# Patient Record
Sex: Female | Born: 1975 | Race: White | Hispanic: No | Marital: Married | Smoking: Never smoker
Health system: Southern US, Community
[De-identification: ages and names within clinical notes are randomized; demographics above are authoritative.]

## PROBLEM LIST (undated history)

## (undated) DIAGNOSIS — Z803 Family history of malignant neoplasm of breast: Secondary | ICD-10-CM

## (undated) DIAGNOSIS — N941 Unspecified dyspareunia: Secondary | ICD-10-CM

## (undated) DIAGNOSIS — D259 Leiomyoma of uterus, unspecified: Secondary | ICD-10-CM

## (undated) DIAGNOSIS — N921 Excessive and frequent menstruation with irregular cycle: Secondary | ICD-10-CM

## (undated) DIAGNOSIS — D500 Iron deficiency anemia secondary to blood loss (chronic): Secondary | ICD-10-CM

## (undated) HISTORY — PX: WISDOM TOOTH EXTRACTION: SHX21

## (undated) HISTORY — PX: NO PAST SURGERIES: SHX2092

## (undated) HISTORY — PX: COLONOSCOPY: SHX174

## (undated) HISTORY — DX: Family history of malignant neoplasm of breast: Z80.3

---

## 1998-07-04 ENCOUNTER — Other Ambulatory Visit: Admission: RE | Admit: 1998-07-04 | Discharge: 1998-07-04 | Payer: Self-pay | Admitting: *Deleted

## 1998-07-27 ENCOUNTER — Ambulatory Visit (HOSPITAL_COMMUNITY): Admission: RE | Admit: 1998-07-27 | Discharge: 1998-07-27 | Payer: Self-pay | Admitting: *Deleted

## 1999-07-02 ENCOUNTER — Other Ambulatory Visit: Admission: RE | Admit: 1999-07-02 | Discharge: 1999-07-02 | Payer: Self-pay | Admitting: Obstetrics and Gynecology

## 2000-01-02 ENCOUNTER — Inpatient Hospital Stay (HOSPITAL_COMMUNITY): Admission: AD | Admit: 2000-01-02 | Discharge: 2000-01-02 | Payer: Self-pay | Admitting: Obstetrics and Gynecology

## 2000-01-23 ENCOUNTER — Encounter: Payer: Self-pay | Admitting: *Deleted

## 2000-01-23 ENCOUNTER — Inpatient Hospital Stay (HOSPITAL_COMMUNITY): Admission: AD | Admit: 2000-01-23 | Discharge: 2000-01-23 | Payer: Self-pay | Admitting: *Deleted

## 2000-01-28 ENCOUNTER — Inpatient Hospital Stay (HOSPITAL_COMMUNITY): Admission: AD | Admit: 2000-01-28 | Discharge: 2000-01-30 | Payer: Self-pay | Admitting: Obstetrics and Gynecology

## 2000-03-13 ENCOUNTER — Other Ambulatory Visit: Admission: RE | Admit: 2000-03-13 | Discharge: 2000-03-13 | Payer: Self-pay | Admitting: Obstetrics and Gynecology

## 2001-02-23 ENCOUNTER — Other Ambulatory Visit: Admission: RE | Admit: 2001-02-23 | Discharge: 2001-02-23 | Payer: Self-pay | Admitting: Obstetrics and Gynecology

## 2002-02-24 ENCOUNTER — Other Ambulatory Visit: Admission: RE | Admit: 2002-02-24 | Discharge: 2002-02-24 | Payer: Self-pay | Admitting: Obstetrics and Gynecology

## 2003-04-28 ENCOUNTER — Other Ambulatory Visit: Admission: RE | Admit: 2003-04-28 | Discharge: 2003-04-28 | Payer: Self-pay | Admitting: Obstetrics and Gynecology

## 2004-12-04 ENCOUNTER — Inpatient Hospital Stay (HOSPITAL_COMMUNITY): Admission: AD | Admit: 2004-12-04 | Discharge: 2004-12-06 | Payer: Self-pay | Admitting: Obstetrics and Gynecology

## 2010-06-14 ENCOUNTER — Ambulatory Visit (HOSPITAL_COMMUNITY): Admission: RE | Admit: 2010-06-14 | Discharge: 2010-06-14 | Payer: Self-pay | Admitting: Obstetrics and Gynecology

## 2010-08-25 ENCOUNTER — Encounter: Payer: Self-pay | Admitting: Obstetrics and Gynecology

## 2011-06-07 ENCOUNTER — Other Ambulatory Visit (HOSPITAL_COMMUNITY): Payer: Self-pay | Admitting: Obstetrics and Gynecology

## 2011-06-07 DIAGNOSIS — Z1231 Encounter for screening mammogram for malignant neoplasm of breast: Secondary | ICD-10-CM

## 2011-07-16 ENCOUNTER — Ambulatory Visit (HOSPITAL_COMMUNITY)
Admission: RE | Admit: 2011-07-16 | Discharge: 2011-07-16 | Disposition: A | Discharge status: Home / Self Care | Payer: PRIVATE HEALTH INSURANCE | Source: Ambulatory Visit | Attending: Obstetrics and Gynecology | Admitting: Obstetrics and Gynecology

## 2011-07-16 DIAGNOSIS — Z1231 Encounter for screening mammogram for malignant neoplasm of breast: Secondary | ICD-10-CM | POA: Insufficient documentation

## 2012-07-09 ENCOUNTER — Other Ambulatory Visit (HOSPITAL_COMMUNITY): Payer: Self-pay | Admitting: Obstetrics and Gynecology

## 2012-07-09 DIAGNOSIS — Z1231 Encounter for screening mammogram for malignant neoplasm of breast: Secondary | ICD-10-CM

## 2012-08-11 ENCOUNTER — Ambulatory Visit (HOSPITAL_COMMUNITY)
Admission: RE | Admit: 2012-08-11 | Discharge: 2012-08-11 | Disposition: A | Discharge status: Home / Self Care | Payer: PRIVATE HEALTH INSURANCE | Source: Ambulatory Visit | Attending: Obstetrics and Gynecology | Admitting: Obstetrics and Gynecology

## 2012-08-11 DIAGNOSIS — Z1231 Encounter for screening mammogram for malignant neoplasm of breast: Secondary | ICD-10-CM | POA: Insufficient documentation

## 2012-08-11 DIAGNOSIS — Z803 Family history of malignant neoplasm of breast: Secondary | ICD-10-CM | POA: Insufficient documentation

## 2014-05-17 ENCOUNTER — Other Ambulatory Visit (HOSPITAL_COMMUNITY): Payer: Self-pay | Admitting: Obstetrics and Gynecology

## 2014-05-17 DIAGNOSIS — Z1231 Encounter for screening mammogram for malignant neoplasm of breast: Secondary | ICD-10-CM

## 2014-05-24 ENCOUNTER — Ambulatory Visit (HOSPITAL_COMMUNITY)
Admission: RE | Admit: 2014-05-24 | Discharge: 2014-05-24 | Disposition: A | Discharge status: Home / Self Care | Payer: BC Managed Care – PPO | Source: Ambulatory Visit | Attending: Obstetrics and Gynecology | Admitting: Obstetrics and Gynecology

## 2014-05-24 DIAGNOSIS — Z1231 Encounter for screening mammogram for malignant neoplasm of breast: Secondary | ICD-10-CM | POA: Diagnosis present

## 2015-08-22 ENCOUNTER — Other Ambulatory Visit: Payer: Self-pay

## 2015-08-22 DIAGNOSIS — N920 Excessive and frequent menstruation with regular cycle: Secondary | ICD-10-CM | POA: Insufficient documentation

## 2015-08-22 DIAGNOSIS — K2100 Gastro-esophageal reflux disease with esophagitis, without bleeding: Secondary | ICD-10-CM | POA: Insufficient documentation

## 2015-08-22 DIAGNOSIS — M2550 Pain in unspecified joint: Secondary | ICD-10-CM | POA: Insufficient documentation

## 2015-08-22 DIAGNOSIS — N3010 Interstitial cystitis (chronic) without hematuria: Secondary | ICD-10-CM | POA: Insufficient documentation

## 2015-08-22 DIAGNOSIS — G2581 Restless legs syndrome: Secondary | ICD-10-CM | POA: Insufficient documentation

## 2016-11-18 ENCOUNTER — Ambulatory Visit: Payer: Self-pay | Admitting: Advanced Practice Midwife

## 2017-02-02 DIAGNOSIS — Z803 Family history of malignant neoplasm of breast: Secondary | ICD-10-CM

## 2017-02-02 HISTORY — DX: Family history of malignant neoplasm of breast: Z80.3

## 2017-02-17 ENCOUNTER — Encounter: Payer: Self-pay | Admitting: Obstetrics and Gynecology

## 2017-02-17 ENCOUNTER — Ambulatory Visit (INDEPENDENT_AMBULATORY_CARE_PROVIDER_SITE_OTHER): Payer: Self-pay | Admitting: Obstetrics and Gynecology

## 2017-02-17 VITALS — BP 102/70 | HR 60 | Ht 67.0 in | Wt 123.0 lb

## 2017-02-17 DIAGNOSIS — Z803 Family history of malignant neoplasm of breast: Secondary | ICD-10-CM

## 2017-02-17 DIAGNOSIS — Z01419 Encounter for gynecological examination (general) (routine) without abnormal findings: Secondary | ICD-10-CM

## 2017-02-17 DIAGNOSIS — Z3041 Encounter for surveillance of contraceptive pills: Secondary | ICD-10-CM

## 2017-02-17 MED ORDER — NORETHIN ACE-ETH ESTRAD-FE 1-20 MG-MCG(24) PO TABS: 1.0000 | ORAL_TABLET | Freq: Every day | ORAL | 4 refills | Status: DC

## 2017-02-17 NOTE — Progress Notes (Signed)
Gynecology Annual Exam  PCP: Patient, No Pcp Per  Chief Complaint  Patient presents with  . Gynecologic Exam   History of Present Illness:  [name] is a 41 y.o. [phone] who LMP was No LMP recorded (within months)., presents today for her annual examination.  Her menses are mostly regular, however, will occasionally occur off-schedule with the OCPs. She does not have vasomotor sx.   She is single partner, contraception - OCP (estrogen/progesterone). She does not have vaginal dryness.  Last Pap: several years, unknown. SHe is self-pay and has not had a pap smear in a while. Hx of STDs: none  Last mammogram: 2015, [facility] 1 There is a FH of breast cancer. There is no FH of ovarian cancer. The patient does do self-breast exams.  Colonoscopy: no DEXA: has not been screened for osteoporosis  Tobacco use: The patient denies current or previous tobacco use. Alcohol use: none Exercise: moderately active  She does get adequate calcium and Vitamin D in her diet.  The patient wears seatbelts: yes.     Past Medical History:  Diagnosis Date  . Family history of breast cancer    Past Surgical History: Denies  Prior to Admission medications   Medication Sig Start Date End Date Taking? Authorizing Provider  Calcium-Magnesium-Vitamin D (CALCIUM 500 PO) Take by mouth.    [provider]  cholecalciferol (VITAMIN D) 1000 units tablet Take by mouth.    [provider]  Norethindrone Acetate-Ethinyl Estrad-FE (LOESTRIN 24 FE) 1-20 MG-MCG(24) tablet LOESTRIN 24 FE, 1-20MG -MCG (Oral Tablet)  1 Every Day for 0 days  Quantity: 0.00;  Refills: 0   Ordered :11-Nov-2006  Amie CritchleyMitchell, Stephanie ;  Started 11-Nov-2006 Active 11/11/06   [provider]  omeprazole (PRILOSEC) 20 MG capsule Take by mouth. 01/12/14   [provider]  Polyethylene Glycol 3350 GRAN Take by mouth.    [provider]   Allergies: NKDA  Obstetric History: [id number]: G2P2002  Family  History  Problem Relation Age of Onset  . Breast cancer Mother 3729        again at 7150  . Breast cancer Maternal Aunt   . Liver cancer Maternal Grandfather   . Breast cancer Maternal Aunt     Social History   Social History  . Marital status: Married    Spouse name: N/A  . Number of children: N/A  . Years of education: N/A   Occupational History  . Not on file.   Social History Main Topics  . Smoking status: Never Smoker  . Smokeless tobacco: Never Used  . Alcohol use No  . Drug use: No  . Sexual activity: Yes    Birth control/ protection: Pill   Other Topics Concern  . Not on file   Social History Narrative  . No narrative on file    Review of Systems  Constitutional: Negative.   HENT: Negative.   Eyes: Negative.   Respiratory: Negative.   Cardiovascular: Negative.   Gastrointestinal: Positive for constipation. Negative for abdominal pain, blood in stool, diarrhea, heartburn, melena, nausea and vomiting.       Hemorrhoid discomfort  Genitourinary: Negative.   Musculoskeletal: Negative.   Skin: Negative.   Neurological: Negative.   Psychiatric/Behavioral: Negative.      Physical Exam BP 102/70   Pulse 60   Ht 5\' 7"  (1.702 m)   Wt 123 lb (55.8 kg)   LMP  (Within Months)   BMI 19.26 kg/m   Physical Exam  Constitutional: She  is oriented to person, place, and time. She appears well-developed and well-nourished. No distress.  Genitourinary: Uterus normal. Pelvic exam was performed with patient supine. There is no rash, tenderness, lesion or injury on the right labia. There is no rash, tenderness, lesion or injury on the left labia. No erythema, tenderness or bleeding in the vagina. No signs of injury around the vagina. No vaginal discharge found. Right adnexum does not display mass, does not display tenderness and does not display fullness. Left adnexum does not display mass, does not display tenderness and does not display fullness. Cervix does not exhibit motion  tenderness, lesion, discharge or polyp.   Uterus is mobile and anteverted. Uterus is not enlarged, tender or exhibiting a mass.  HENT:  Head: Normocephalic and atraumatic.  Eyes: EOM are normal. No scleral icterus.  Neck: Normal range of motion. Neck supple. No thyromegaly present.  Cardiovascular: Normal rate and regular rhythm.  Exam reveals no gallop and no friction rub.   No murmur heard. Pulmonary/Chest: Effort normal and breath sounds normal. No respiratory distress. She has no wheezes. She has no rales. Right breast exhibits no inverted nipple, no mass, no nipple discharge, no skin change and no tenderness. Left breast exhibits no inverted nipple, no mass, no nipple discharge, no skin change and no tenderness.  Abdominal: Soft. Bowel sounds are normal. She exhibits no distension and no mass. There is no tenderness. There is no rebound and no guarding.  Musculoskeletal: Normal range of motion. She exhibits no edema or tenderness.  Lymphadenopathy:    She has no cervical adenopathy.       Right: No inguinal adenopathy present.       Left: No inguinal adenopathy present.  Neurological: She is alert and oriented to person, place, and time. No cranial nerve deficit.  Skin: Skin is warm and dry. No rash noted. No erythema.  Psychiatric: She has a normal mood and affect. Her behavior is normal. Judgment normal.   Female chaperone present for pelvic and breast  portions of the physical exam  Results: AUDIT Questionnaire (screen for alcoholism): 0 PHQ-9: 0  Assessment: 41 y.o. [id number] female here for routine gynecologic examination.  Plan: Problem List Items Addressed This Visit    Family history of breast cancer    Other Visit Diagnoses    Encounter for surveillance of contraceptive pills    -  Primary   Relevant Medications   Norethindrone Acetate-Ethinyl Estrad-FE (LOESTRIN 24 FE) 1-20 MG-MCG(24) tablet   Women's annual routine gynecological examination         Screening: --  Blood pressure screen normal -- Colonoscopy - not due -- Mammogram - Patient is self-pay. She has a family history of breast cancer and has not had a mammogram in several years. I HIGHLY recommended that she receive a mammogram. She was given information regardin the BCCCP program to see if she is eligible for coverage.  She states that she will look into it.  -- Weight screening: normal -- Depression screening negative (PHQ-9) -- Nutrition: normal -- cholesterol screening: not due for screening -- osteoporosis screening: not due -- tobacco screening: not using -- alcohol screening: AUDIT questionnaire indicates low-risk usage. -- family history of breast cancer screening: done. not at high risk. -- no evidence of domestic violence or intimate partner violence. -- STD screening: gonorrhea/chlamydia NAAT not collected per patient request. -- pap smear She is due for a pap smear per ASCCP guidelines. However, she refuses today due to self-pay status.  BCCCP  reiterated to her, as above.  -- HPV vaccination series: not eligilbe   Family History of Breast Cancer: Discussed screening. As self-pay, she, again, is referred to [facility]  She may be eligible for screening through other programs throughout the state.  She thanked me for bringing this up.  Notably, her prior provider, Dr. Vergie Living, has also discussed this with her.  Thomasene Mohair, MD 02/17/2017 5:35 PM

## 2017-02-17 NOTE — Patient Instructions (Signed)
You have been provided with information to contacting the [facility] program at:  [email or web address].ncdhhs.gov/  Please utilize this resource to see if you are eligible for coverage for mammography and pap smears.

## 2017-02-24 ENCOUNTER — Telehealth: Payer: Self-pay | Admitting: Obstetrics and Gynecology

## 2017-02-24 NOTE — Telephone Encounter (Signed)
Patent has been taking Junel SE 1/20 but recently per Pharmacy was prescribed Junel SE 24, which is much more expensive then what she was taking.  She is not sure why the prescription was changed.  [place] [facility] or call patient and explain why the prescription was changed.

## 2017-02-25 ENCOUNTER — Telehealth: Payer: Self-pay

## 2017-02-25 DIAGNOSIS — N920 Excessive and frequent menstruation with regular cycle: Secondary | ICD-10-CM

## 2017-02-25 MED ORDER — NORETHIN ACE-ETH ESTRAD-FE 1-20 MG-MCG PO TABS
1.0000 | ORAL_TABLET | Freq: Every day | ORAL | 3 refills | Status: DC
Start: 2017-02-25 — End: 2018-01-06

## 2017-02-25 NOTE — Telephone Encounter (Signed)
SDJ sent in refills of pt's bcp but it's a diff rx and it costs more.  Pt wants what she had before - Junel 1mg -20 which is cheaper.  She recv'd Junel Fe 24.  [phone]

## 2017-02-26 ENCOUNTER — Encounter: Payer: Self-pay | Admitting: Physician Assistant

## 2017-02-26 ENCOUNTER — Ambulatory Visit: Payer: Self-pay | Admitting: Physician Assistant

## 2017-02-26 VITALS — BP 112/68 | HR 60 | Temp 98.2°F | Resp 16 | Wt 122.0 lb

## 2017-02-26 DIAGNOSIS — K645 Perianal venous thrombosis: Secondary | ICD-10-CM

## 2017-02-26 NOTE — Patient Instructions (Signed)
Surgical Procedures for Hemorrhoids Surgical procedures can be used to treat hemorrhoids. Hemorrhoids are swollen veins that are inside the rectum (internal hemorrhoids) or around the anus (external hemorrhoids). They are caused by increased pressure in the anal area. This pressure may result from straining to have a bowel movement (constipation), diarrhea, pregnancy, obesity, anal sex, or sitting for long periods of time. Hemorrhoids can cause symptoms such as pain and bleeding. Surgery may be needed if diet changes, lifestyle changes, and other treatments do not help your symptoms. Various surgical methods may be used. Three common methods are:  Closed hemorrhoidectomy. The hemorrhoids are surgically removed, and the surgical cuts (incisions) are closed with stitches (sutures).  Open hemorrhoidectomy. The hemorrhoids are surgically removed, but the incisions are allowed to heal without sutures.  Stapled hemorrhoidopexy. The hemorrhoids are removed using a device that takes out a ring of excess tissue.  Tell a health care provider about:  Any allergies you have.  All medicines you are taking, including vitamins, herbs, eye drops, creams, and over-the-counter medicines.  Any problems you or family members have had with anesthetic medicines.  Any blood disorders you have.  Any surgeries you have had.  Any medical conditions you have.  Whether you are pregnant or may be pregnant. What are the risks? Generally, this is a safe procedure. However, problems may occur, including:  Infection.  Bleeding.  Allergic reactions to medicines.  Damage to other structures or organs.  Pain.  Constipation.  Difficulty passing urine.  Narrowing of the anal canal (stenosis).  Difficulty controlling bowel movements (incontinence).  What happens before the procedure?  Ask your health care provider about: ? Changing or stopping your regular medicines. This is especially important if you are  taking diabetes medicines or blood thinners. ? Taking medicines such as aspirin and ibuprofen. These medicines can thin your blood. Do not take these medicines before your procedure if your health care provider instructs you not to.  You may need to have a procedure to examine the inside of your colon with a scope (colonoscopy). Your health care provider may do this to make sure that there are no other causes for your bleeding or pain.  Follow instructions from your health care provider about eating or drinking restrictions.  You may be instructed to take a laxative and an enema to clean out your colon before surgery (bowel prep). Carefully follow instructions from your health care provider about bowel prep.  Ask your health care provider how your surgical site will be marked or identified.  You may be given antibiotic medicine to help prevent infection.  Plan to have someone take you home after the procedure. What happens during the procedure?  To reduce your risk of infection: ? Your health care team will wash or sanitize their hands. ? Your skin will be washed with soap.  An IV tube will be inserted into one of your veins.  You will be given one or more of the following: ? A medicine to help you relax (sedative). ? A medicine to numb the area (local anesthetic). ? A medicine to make you fall asleep (general anesthetic). ? A medicine that is injected into an area of your body to numb everything below the injection site (regional anesthetic).  A lubricating jelly may be placed into your rectum.  Your surgeon will insert a short scope (anoscope) into your rectum to examine the hemorrhoids.  One of the following hemorrhoid procedures will be performed. Closed Hemorrhoidectomy  Your surgeon   will use surgical instruments to open the tissue around the hemorrhoids.  The veins that supply the hemorrhoids will be tied off with a suture.  The hemorrhoids will be removed.  The tissue  that surrounds the hemorrhoids will be closed with sutures that your body can absorb (absorbable sutures). Open Hemorrhoidectomy  The hemorrhoids will be removed with surgical instruments.  The incisions will be left open to heal without sutures. Stapled Hemorrhoidopexy  Your surgeon will use a circular stapling device to remove the hemorrhoids.  The device will be inserted into your anus. It will remove a circular ring of tissue that includes hemorrhoid tissue and some tissue above the hemorrhoids.  The staples in the device will close the edges of removed tissue. This will cut off the blood supply to the hemorrhoids and will pull any remaining hemorrhoids back into place. Each of these procedures may vary among health care providers and hospitals. What happens after the procedure?  Your blood pressure, heart rate, breathing rate, and blood oxygen level will be monitored often until the medicines you were given have worn off.  You will be given pain medicine as needed. This information is not intended to replace advice given to you by your health care provider. Make sure you discuss any questions you have with your health care provider. Document Released: 05/19/2009 Document Revised: 12/28/2015 Document Reviewed: 10/17/2014 Elsevier Interactive Patient Education  2018 Elsevier Inc.  

## 2017-02-26 NOTE — Progress Notes (Signed)
       Patient: Brittany Murray Female    DOB: October 22, 1975   41 y.o.   MRN: [id number] Visit Date: 02/26/2017  Today's Provider: Trey SailorsAdriana M Timmothy Baranowski, PA-C   Chief Complaint  Patient presents with  . Hemorrhoids    Trouble since April but worsening since Sunday   Subjective:    HPI  Hemorrhoids: Patient complains of evaluation of rectal bleeding. Onset of symptoms was gradual starting 3 months ago ago with gradually worsening course since that time.  She describes symptoms as bleeding which only occurs with bowel movements, pain with sitting and painful defecation. Treatment to date has been OTC creams: ineffective. Patient denies family hx of colorectal CA, history of previous STDs and known or suspected STD exposure.  No Known Allergies   Current Outpatient Prescriptions:  .  Calcium-Magnesium-Vitamin D (CALCIUM 500 PO), Take by mouth., Disp: , Rfl:  .  cholecalciferol (VITAMIN D) 1000 units tablet, Take by mouth., Disp: , Rfl:  .  norethindrone-ethinyl estradiol (JUNEL FE 1/20) 1-20 MG-MCG tablet, Take 1 tablet by mouth daily., Disp: 84 tablet, Rfl: 3 .  omeprazole (PRILOSEC) 20 MG capsule, Take by mouth., Disp: , Rfl:  .  Polyethylene Glycol 3350 GRAN, Take by mouth., Disp: , Rfl:   Review of Systems  Constitutional: Negative.   Gastrointestinal: Positive for anal bleeding and rectal pain. Negative for abdominal distention, blood in stool, constipation and diarrhea.  Neurological: Negative for dizziness, light-headedness and headaches.    Social History  Substance Use Topics  . Smoking status: Never Smoker  . Smokeless tobacco: Never Used  . Alcohol use No   Objective:   BP 112/68 (BP Location: Left Arm, Patient Position: Sitting, Cuff Size: Normal)   Pulse 60   Temp 98.2 F (36.8 C) (Oral)   Resp 16   Wt 122 lb (55.3 kg)   BMI 19.11 kg/m  Vitals:   02/26/17 1112  BP: 112/68  Pulse: 60  Resp: 16  Temp: 98.2 F (36.8 C)  TempSrc: Oral  Weight: 122 lb (55.3 kg)      Physical Exam  Constitutional: She is oriented to person, place, and time.  Genitourinary: Rectal exam shows external hemorrhoid and tenderness. Rectal exam shows no fissure.  Genitourinary Comments: Thrombosed external hemorrhoid present.  Neurological: She is alert and oriented to person, place, and time.  Skin: Skin is warm and dry.  Psychiatric: She has a normal mood and affect. Her behavior is normal.        Assessment & Plan:     1. Thrombosed external hemorrhoid  She'll be scheduled tomorrow with Antony ContrasJenni to I&D the hemorrhoid.   Return in about 1 day (around 02/27/2017) [facility].  The entirety of the information documented in the History of Present Illness, Review of Systems and Physical Exam were personally obtained by me. Portions of this information were initially documented by Kavin LeechLaura Walsh, CMA and reviewed by me for thoroughness and accuracy.          Trey SailorsAdriana M Adin Laker, PA-C  [facility]

## 2017-02-27 ENCOUNTER — Encounter: Payer: Self-pay | Admitting: Physician Assistant

## 2017-02-27 ENCOUNTER — Ambulatory Visit (INDEPENDENT_AMBULATORY_CARE_PROVIDER_SITE_OTHER): Payer: Self-pay | Admitting: Physician Assistant

## 2017-02-27 VITALS — BP 118/62 | Temp 98.2°F | Resp 16 | Wt 123.0 lb

## 2017-02-27 DIAGNOSIS — K645 Perianal venous thrombosis: Secondary | ICD-10-CM

## 2017-02-27 NOTE — Progress Notes (Signed)
       Patient: Brittany Murray Female    DOB: 08-12-1975   41 y.o.   MRN: [id number] Visit Date: 02/27/2017  Today's Provider: Margaretann LovelessJennifer M Taffy Delconte, PA-C   Chief Complaint  Patient presents with  . Hemorrhoids   Subjective:    HPI Patient comes in today c/o an external hemmorrhoid. She reports that she has had issues with hemorrhoids since April (3 months), but after Sunday and having a bowel movement, she reports that her pain has gotten worse. She has been doing sitz baths and preparation H without relief. Minimal bleeding noted. Tender to palpation.     No Known Allergies   Current Outpatient Prescriptions:  .  Calcium-Magnesium-Vitamin D (CALCIUM 500 PO), Take by mouth., Disp: , Rfl:  .  cholecalciferol (VITAMIN D) 1000 units tablet, Take by mouth., Disp: , Rfl:  .  norethindrone-ethinyl estradiol (JUNEL FE 1/20) 1-20 MG-MCG tablet, Take 1 tablet by mouth daily., Disp: 84 tablet, Rfl: 3 .  Polyethylene Glycol 3350 GRAN, Take by mouth., Disp: , Rfl:  .  omeprazole (PRILOSEC) 20 MG capsule, Take by mouth., Disp: , Rfl:   Review of Systems  Constitutional: Negative.   Respiratory: Negative.   Cardiovascular: Negative.   Gastrointestinal: Positive for anal bleeding, blood in stool and rectal pain. Negative for abdominal distention, abdominal pain, constipation, diarrhea, nausea and vomiting.  Genitourinary: Negative.   Musculoskeletal: Negative for back pain.    Social History  Substance Use Topics  . Smoking status: Never Smoker  . Smokeless tobacco: Never Used  . Alcohol use No   Objective:   BP 118/62 (BP Location: Right Arm, Patient Position: Sitting, Cuff Size: Normal)   Temp 98.2 F (36.8 C)   Resp 16   Wt 123 lb (55.8 kg)   BMI 19.26 kg/m  Vitals:   02/27/17 0804  BP: 118/62  Resp: 16  Temp: 98.2 F (36.8 C)  Weight: 123 lb (55.8 kg)     Physical Exam  Constitutional: She appears well-developed and well-nourished. No distress.  Neck: Normal range of  motion. Neck supple.  Genitourinary:     Vitals reviewed.       Assessment & Plan:     1. External hemorrhoid, thrombosed Excision of thrombosed hemorrhoid was performed (see procedure note below). There was not much clot cleared during procedure. I am suspecting that this is more an internal hemorrhoid communicating to an external hemorrhoid. She is to continue sitz baths and Preparation H. Discussed use of Tuks pads. Also had in depth discussion for bowel health and keeping BM soft and regular. She is to call if symptoms worsen.   Procedure Note:  Procedure, benefits and risks (including bleeding, infection and allergic reaction) and alternatives were explained to the patient. All questions were sought and answered. Verbal consent was obtained.  The area was then prepped with betadine swabs and draped in a sterile fashion. Local anesthetic was administered with 1.5cc of 2% xylocaine with epinephrine. An incision was then made using a size 10 scalpel blade. The area was then drained until no more clot material could be expelled. The area was then cleaned and covered with a dry dressing. There were no complications and patient tolerated well. Wound cleansing instructions and signs of infection were verbally given to the patient.        Margaretann LovelessJennifer M Arneisha Kincannon, PA-C  [facility]

## 2017-02-27 NOTE — Patient Instructions (Signed)
Hemorrhoids    Hemorrhoids are swollen veins in and around the rectum or anus. Hemorrhoids can cause pain, itching, or bleeding. Most of the time, they do not cause serious problems. They usually get better with diet changes, lifestyle changes, and other home treatments.  Follow these instructions at home:  Eating and drinking  · Eat foods that have fiber, such as whole grains, beans, nuts, fruits, and vegetables. Ask your doctor about taking products that have added fiber (fiber supplements).  · Drink enough fluid to keep your pee (urine) clear or pale yellow.  For Pain and Swelling  · Take a warm-water bath (sitz bath) for 20 minutes to ease pain. Do this 3-4 times a day.  · If directed, put ice on the painful area. It may be helpful to use ice between your warm baths.  ¨ Put ice in a plastic bag.  ¨ Place a towel between your skin and the bag.  ¨ Leave the ice on for 20 minutes, 2-3 times a day.  General instructions  · Take over-the-counter and prescription medicines only as told by your doctor.  ¨ Medicated creams and medicines that are inserted into the anus (suppositories) may be used or applied as told.  · Exercise often.  · Go to the bathroom when you have the urge to poop (to have a bowel movement). Do not wait.  · Avoid pushing too hard (straining) when you poop.  · Keep the butt area dry and clean. Use wet toilet paper or moist paper towels.  · Do not sit on the toilet for a long time.  Contact a doctor if:  · You have any of these:  ¨ Pain and swelling that do not get better with treatment or medicine.  ¨ Bleeding that will not stop.  ¨ Trouble pooping or you cannot poop.  ¨ Pain or swelling outside the area of the hemorrhoids.  This information is not intended to replace advice given to you by your health care provider. Make sure you discuss any questions you have with your health care provider.  Document Released: 04/30/2008 Document Revised: 12/28/2015 Document Reviewed: 04/05/2015  Elsevier  Interactive Patient Education © 2018 Elsevier Inc.   

## 2017-02-28 ENCOUNTER — Telehealth: Payer: Self-pay | Admitting: Physician Assistant

## 2017-02-28 NOTE — Telephone Encounter (Signed)
Noted  

## 2017-02-28 NOTE — Telephone Encounter (Signed)
Patient advised. She states she will hold off on the RX at this time.

## 2017-02-28 NOTE — Telephone Encounter (Signed)
Pt was in yesterday and had a procedure regarding a hemorrhoid.  She is still having a lot of pain after a bowel movement and wants to know if this is normal  Pt's call back [phone]  Thanks teri

## 2017-02-28 NOTE — Telephone Encounter (Signed)
LMTCB  Thanks,  -Joseline 

## 2017-02-28 NOTE — Telephone Encounter (Signed)
Pt is returning call. CB#[phone]/MW

## 2017-02-28 NOTE — Telephone Encounter (Signed)
It can be normal for a day or two until the incision heals. I am worried this is more of an internal hemorrhoid issue, however. I can prescribe a rectal supp called Anusol HC that may help decrease the size of the internal hemorrhoid but it may be expensive. I am unsure of OOP cost. If this does not help she may require a referral to Gen Surgery for further evaluation.  Checking the GoodRx website it appears that [facility] in [place] may be the cheapest locally. It is $85.38 for 12 there where [facility] it shows to be $118.68.  If she would like to try this I will gladly send in.

## 2017-03-05 ENCOUNTER — Encounter: Payer: Self-pay | Admitting: Obstetrics and Gynecology

## 2017-09-16 ENCOUNTER — Other Ambulatory Visit: Payer: Self-pay | Admitting: Obstetrics and Gynecology

## 2017-09-16 DIAGNOSIS — Z1231 Encounter for screening mammogram for malignant neoplasm of breast: Secondary | ICD-10-CM

## 2017-09-23 ENCOUNTER — Ambulatory Visit
Admission: RE | Admit: 2017-09-23 | Discharge: 2017-09-23 | Disposition: A | Discharge status: Home / Self Care | Payer: Self-pay | Source: Ambulatory Visit | Attending: Obstetrics and Gynecology | Admitting: Obstetrics and Gynecology

## 2017-09-23 ENCOUNTER — Encounter (INDEPENDENT_AMBULATORY_CARE_PROVIDER_SITE_OTHER): Payer: Self-pay

## 2017-09-23 DIAGNOSIS — Z1231 Encounter for screening mammogram for malignant neoplasm of breast: Secondary | ICD-10-CM

## 2018-01-06 ENCOUNTER — Other Ambulatory Visit: Payer: Self-pay

## 2018-01-06 DIAGNOSIS — N920 Excessive and frequent menstruation with regular cycle: Secondary | ICD-10-CM

## 2018-01-06 MED ORDER — NORETHIN ACE-ETH ESTRAD-FE 1-20 MG-MCG PO TABS: 1.0000 | ORAL_TABLET | Freq: Every day | ORAL | 0 refills | Status: DC

## 2018-01-06 NOTE — Telephone Encounter (Signed)
Pt aware rx sent in. Annual 02/06/18. Sent for 90 day supply due to insurance.

## 2018-02-06 ENCOUNTER — Ambulatory Visit (INDEPENDENT_AMBULATORY_CARE_PROVIDER_SITE_OTHER): Payer: Self-pay | Admitting: Obstetrics and Gynecology

## 2018-02-06 ENCOUNTER — Telehealth: Payer: Self-pay

## 2018-02-06 ENCOUNTER — Encounter: Payer: Self-pay | Admitting: Obstetrics and Gynecology

## 2018-02-06 VITALS — BP 104/68 | HR 57 | Ht 67.0 in | Wt 125.0 lb

## 2018-02-06 DIAGNOSIS — Z124 Encounter for screening for malignant neoplasm of cervix: Secondary | ICD-10-CM

## 2018-02-06 DIAGNOSIS — Z01419 Encounter for gynecological examination (general) (routine) without abnormal findings: Secondary | ICD-10-CM

## 2018-02-06 DIAGNOSIS — Z803 Family history of malignant neoplasm of breast: Secondary | ICD-10-CM

## 2018-02-06 DIAGNOSIS — Z1331 Encounter for screening for depression: Secondary | ICD-10-CM

## 2018-02-06 DIAGNOSIS — Z1339 Encounter for screening examination for other mental health and behavioral disorders: Secondary | ICD-10-CM

## 2018-02-06 DIAGNOSIS — Z3041 Encounter for surveillance of contraceptive pills: Secondary | ICD-10-CM

## 2018-02-06 DIAGNOSIS — N920 Excessive and frequent menstruation with regular cycle: Secondary | ICD-10-CM

## 2018-02-06 LAB — HM PAP SMEAR: HM Pap smear: NEGATIVE

## 2018-02-06 MED ORDER — NORETHIN ACE-ETH ESTRAD-FE 1-20 MG-MCG PO TABS
1.0000 | ORAL_TABLET | Freq: Every day | ORAL | 4 refills | Status: DC
Start: 2018-02-06 — End: 2019-03-05

## 2018-02-06 NOTE — Patient Instructions (Signed)

## 2018-02-06 NOTE — Telephone Encounter (Signed)
Pt calling to let SDJ know her mom did have the cancer testing done 10 years ago and came back negative for breast cancer. Pt states she does not need a call back, just letting sdj know her mom had the testing done.

## 2018-02-06 NOTE — Progress Notes (Signed)
Gynecology Annual Exam  PCP: Trinna Post, PA-C  Chief Complaint  Patient presents with  . Gynecologic Exam   History of Present Illness:  [name] is a 42 y.o. [phone] who LMP was No LMP recorded (lmp unknown)., presents today for her annual examination.  Her menses are irregular, not every month, lasting 6 day(s).  Dysmenorrhea none. She does not have intermenstrual bleeding.  She does not have vasomotor sx.   She is single partner, contraception - OCP (estrogen/progesterone). She does not have vaginal dryness.  Last Pap: several years  Results were: no abnormalities /neg HPV DNA.  Hx of STDs: none  Last mammogram: 09/23/2017  Results were: normal--routine follow-up in 12 months There is no a of breast cancer.  Her mother had two primary breast cancers (in her 61's and 40s), and her great grandmother had breast cancer. There is no FH of ovarian cancer. The patient does do self-breast exams.  Colonoscopy: no DEXA: has not been screened for osteoporosis  Tobacco use: The patient denies current or previous tobacco use. Alcohol use: none Exercise: moderately active  The patient wears seatbelts: yes.     Past Medical History:  Diagnosis Date  . Family history of breast cancer 02/2017   qualifies for genetic testing when has insurance    Past Surgical History:  Procedure Laterality Date  . NO PAST SURGERIES      Prior to Admission medications   Medication Sig Start Date End Date Taking? Authorizing Provider  CALCIUM PO Take by mouth.   Yes [provider]  cholecalciferol (VITAMIN D) 1000 units tablet Take by mouth.   Yes [provider]  norethindrone-ethinyl estradiol (JUNEL FE 1/20) 1-20 MG-MCG tablet Take 1 tablet by mouth daily. 01/06/18 03/31/18 Yes Will Bonnet, MD  Polyethylene Glycol 3350 GRAN Take by mouth.   Yes [provider]  vitamin B-12 (CYANOCOBALAMIN) 500 MCG tablet    Yes [provider]  vitamin C  (ASCORBIC ACID) 500 MG tablet    Yes [provider]   Allergies: No Known Allergies  Obstetric History: [id number]  Family History  Problem Relation Age of Onset  . Breast cancer Mother 59        again at 93  . Breast cancer Maternal Aunt 75  . Liver cancer Maternal Grandfather   . Breast cancer Maternal Aunt 61  . Breast cancer Maternal Grandmother        mat great gm    Social History   Socioeconomic History  . Marital status: Married    Spouse name: Not on file  . Number of children: Not on file  . Years of education: Not on file  . Highest education level: Not on file  Occupational History  . Not on file  Social Needs  . Financial resource strain: Not on file  . Food insecurity:    Worry: Not on file    Inability: Not on file  . Transportation needs:    Medical: Not on file    Non-medical: Not on file  Tobacco Use  . Smoking status: Never Smoker  . Smokeless tobacco: Never Used  Substance and Sexual Activity  . Alcohol use: No  . Drug use: No  . Sexual activity: Yes    Birth control/protection: Pill  Lifestyle  . Physical activity:    Days per week: 4 days    Minutes per session: Not on file  . Stress: Not on file  Relationships  . Social  connections:    Talks on phone: Not on file    Gets together: Not on file    Attends religious service: Not on file    Active member of club or organization: Not on file    Attends meetings of clubs or organizations: Not on file    Relationship status: Not on file  . Intimate partner violence:    Fear of current or ex partner: Not on file    Emotionally abused: Not on file    Physically abused: Not on file    Forced sexual activity: Not on file  Other Topics Concern  . Not on file  Social History Narrative  . Not on file    Review of Systems  Constitutional: Negative.   HENT: Negative.   Eyes: Negative.   Respiratory: Negative.   Cardiovascular: Negative.   Gastrointestinal: Positive for  constipation. Negative for abdominal pain, blood in stool, diarrhea, heartburn, melena, nausea and vomiting.  Genitourinary: Negative.   Musculoskeletal: Negative.   Skin: Negative.   Neurological: Negative.   Psychiatric/Behavioral: Negative.      Physical Exam BP 104/68 (BP Location: Left Arm, Patient Position: Sitting, Cuff Size: Normal)   Pulse (!) 57   Ht 5' 7"  (1.702 m)   Wt 125 lb (56.7 kg)   LMP  (LMP Unknown)   SpO2 99%   BMI 19.58 kg/m   Physical Exam  Constitutional: She is oriented to person, place, and time. She appears well-developed and well-nourished. No distress.  Genitourinary: Uterus normal. Pelvic exam was performed with patient supine. There is no rash, tenderness, lesion or injury on the right labia. There is no rash, tenderness, lesion or injury on the left labia. No erythema, tenderness or bleeding in the vagina. No signs of injury around the vagina. No vaginal discharge found. Right adnexum does not display mass, does not display tenderness and does not display fullness. Left adnexum does not display mass, does not display tenderness and does not display fullness. Cervix does not exhibit motion tenderness, lesion, discharge or polyp.   Uterus is mobile and anteverted. Uterus is not enlarged, tender or exhibiting a mass.  HENT:  Head: Normocephalic and atraumatic.  Eyes: EOM are normal. No scleral icterus.  Neck: Normal range of motion. Neck supple. No thyromegaly present.  Cardiovascular: Normal rate and regular rhythm. Exam reveals no gallop and no friction rub.  No murmur heard. Pulmonary/Chest: Effort normal and breath sounds normal. No respiratory distress. She has no wheezes. She has no rales. Right breast exhibits no inverted nipple, no mass, no nipple discharge, no skin change and no tenderness. Left breast exhibits no inverted nipple, no mass, no nipple discharge, no skin change and no tenderness.  Abdominal: Soft. Bowel sounds are normal. She exhibits no  distension and no mass. There is no tenderness. There is no rebound and no guarding.  Musculoskeletal: Normal range of motion. She exhibits no edema or tenderness.  Lymphadenopathy:    She has no cervical adenopathy.       Right: No inguinal adenopathy present.       Left: No inguinal adenopathy present.  Neurological: She is alert and oriented to person, place, and time. No cranial nerve deficit.  Skin: Skin is warm and dry. No rash noted. No erythema.  Psychiatric: She has a normal mood and affect. Her behavior is normal. Judgment normal.    Female chaperone present for pelvic and breast  portions of the physical exam  Results: AUDIT Questionnaire (screen for alcoholism):  0 PHQ-9: 0  Assessment: 42 y.o. [id number] female here for routine gynecologic examination.  Plan: Problem List Items Addressed This Visit      Other   Family history of breast cancer    Other Visit Diagnoses    Women's annual routine gynecological examination    -  Primary   Screening for depression       Screening for alcoholism       Pap smear for cervical cancer screening          Screening: -- Blood pressure screen normal -- Colonoscopy - not due -- Mammogram - not due -- Weight screening: normal -- Depression screening negative (PHQ-9) -- Nutrition: normal -- cholesterol screening: not due for screening -- osteoporosis screening: not due -- tobacco screening: not using -- alcohol screening: AUDIT questionnaire indicates low-risk usage. -- no evidence of domestic violence or intimate partner violence. -- STD screening: gonorrhea/chlamydia NAAT not collected per patient request. -- pap smear collected per ASCCP guidelines (will utilize [facility]) -- HPV vaccination series: not eligilbe  -- family history of breast cancer screening: done. She is at elevated risk and did get a mammogram today. Discussed testing for BRCA and genetic mutations and familial risk using risk models like Tyrer-Cuzik. She will  consider genetic screening testing as she is self-pay.  Discussed that, at a minimum, we could run her risk through a risk model and decide if she is at an elevated-enough risk to do increased surveillance.  She would like to consider this and will let me know.  Prentice Docker, MD 02/06/2018 8:46 AM

## 2018-02-19 ENCOUNTER — Encounter (INDEPENDENT_AMBULATORY_CARE_PROVIDER_SITE_OTHER): Payer: Self-pay

## 2018-03-11 ENCOUNTER — Telehealth: Payer: Self-pay | Admitting: Obstetrics and Gynecology

## 2018-03-25 NOTE — Telephone Encounter (Signed)
Patient  is calling about an bill she received on a pap smear. Patient was advised by Dr. Jackson her pap smear Jean Rosenthalwould be $25.00 through [facility]. Please advise patient doesn't want this to go to collections. [facility] said we need to call the rep to add the discount. For her to be billed at the 25.00 mark. Would you please review this and let the patient know why she is being bill more that 25.00. Patient states to leave detail message she will not be able to be reach until Tuesday bill is due tomorrow. Thank you

## 2018-03-26 NOTE — Telephone Encounter (Signed)
Spoke w/[facility]. We currently do not have a rep. Waiting on a call back from Remo LippsKelly Murray who is over all reps.

## 2019-03-05 ENCOUNTER — Other Ambulatory Visit: Payer: Self-pay | Admitting: Obstetrics and Gynecology

## 2019-03-05 DIAGNOSIS — Z3041 Encounter for surveillance of contraceptive pills: Secondary | ICD-10-CM

## 2019-03-05 DIAGNOSIS — N920 Excessive and frequent menstruation with regular cycle: Secondary | ICD-10-CM

## 2019-03-05 NOTE — Telephone Encounter (Signed)
Pt scheduled AE w/SDJ 06/17/2019. Requesting refill of Junel FE 1/20

## 2019-03-05 NOTE — Telephone Encounter (Signed)
Pt notified refill sent #84 days

## 2019-04-26 ENCOUNTER — Telehealth: Payer: Self-pay | Admitting: Obstetrics and Gynecology

## 2019-04-26 NOTE — Telephone Encounter (Signed)
Patient has annual scheduled 11/12, needs refill of Junelle to get her to the visit.  [place].

## 2019-04-28 ENCOUNTER — Other Ambulatory Visit: Payer: Self-pay

## 2019-04-28 DIAGNOSIS — Z3041 Encounter for surveillance of contraceptive pills: Secondary | ICD-10-CM

## 2019-04-28 DIAGNOSIS — N920 Excessive and frequent menstruation with regular cycle: Secondary | ICD-10-CM

## 2019-04-28 MED ORDER — NORETHIN ACE-ETH ESTRAD-FE 1-20 MG-MCG PO TABS: 1.0000 | ORAL_TABLET | Freq: Every day | ORAL | 0 refills | Status: DC

## 2019-04-28 NOTE — Telephone Encounter (Signed)
Refill sent for pt to last to annual

## 2019-06-17 ENCOUNTER — Encounter: Payer: Self-pay | Admitting: Obstetrics and Gynecology

## 2019-06-17 ENCOUNTER — Ambulatory Visit (INDEPENDENT_AMBULATORY_CARE_PROVIDER_SITE_OTHER): Payer: Self-pay | Admitting: Obstetrics and Gynecology

## 2019-06-17 ENCOUNTER — Other Ambulatory Visit: Payer: Self-pay

## 2019-06-17 VITALS — BP 118/74 | Ht 67.0 in | Wt 123.0 lb

## 2019-06-17 DIAGNOSIS — Z3041 Encounter for surveillance of contraceptive pills: Secondary | ICD-10-CM

## 2019-06-17 DIAGNOSIS — Z1331 Encounter for screening for depression: Secondary | ICD-10-CM

## 2019-06-17 DIAGNOSIS — Z1339 Encounter for screening examination for other mental health and behavioral disorders: Secondary | ICD-10-CM

## 2019-06-17 DIAGNOSIS — N920 Excessive and frequent menstruation with regular cycle: Secondary | ICD-10-CM

## 2019-06-17 DIAGNOSIS — Z01419 Encounter for gynecological examination (general) (routine) without abnormal findings: Secondary | ICD-10-CM

## 2019-06-17 DIAGNOSIS — Z803 Family history of malignant neoplasm of breast: Secondary | ICD-10-CM

## 2019-06-17 MED ORDER — NORETHIN ACE-ETH ESTRAD-FE 1-20 MG-MCG PO TABS: 1.0000 | ORAL_TABLET | Freq: Every day | ORAL | 4 refills | Status: DC

## 2019-06-17 NOTE — Progress Notes (Signed)
Gynecology Annual Exam  PCP: Trey SailorsPollak, Brittany M, PA-C  Chief Complaint  Patient presents with  . Annual Exam    History of Present Illness:  [name] is a 43 y.o. [id number] who LMP was Patient's last menstrual period was 05/17/2019., presents today for her annual examination.  Her menses are a little abnormal. She states she missed three days a few months ago and her pattern has gone away since that time.  When she does have bleeding it is light. She has no dysmenorrhea   She is sexually active. No issues with intercourse. .  Last Pap: 1 year  Results were: no abnormalities /neg HPV DNA negative Hx of STDs: none  Her mother had two primary breast cancers (in her 10620's and 5940s), and her great grandmother had breast cancer. There is no FH of ovarian cancer. The patient does do self-breast exams. There is no FH of ovarian cancer.   Tyrer-Cuzick score: 40.5% lifetime  Tobacco use: The patient denies current or previous tobacco use. Alcohol use: none Exercise: very active  The patient wears seatbelts: yes.   The patient reports that domestic violence in her life is absent.   Past Medical History:  Diagnosis Date  . Family history of breast cancer 02/2017   qualifies for genetic testing when has insurance    Past Surgical History:  Procedure Laterality Date  . NO PAST SURGERIES      Prior to Admission medications   Medication Sig Start Date End Date Taking? Authorizing Provider  CALCIUM PO Take by mouth.   Yes [provider]  cholecalciferol (VITAMIN D) 1000 units tablet Take by mouth.   Yes [provider]  norethindrone-ethinyl estradiol (JUNEL FE 1/20) 1-20 MG-MCG tablet Take 1 tablet by mouth daily. 04/28/19  Yes Conard NovakJackson, Tamecia Mcdougald D, MD  vitamin B-12 (CYANOCOBALAMIN) 500 MCG tablet    Yes [provider]  vitamin C (ASCORBIC ACID) 500 MG tablet    Yes [provider]  Polyethylene Glycol 3350 GRAN Take by mouth.    [provider]   Allergies: No Known Allergies  Obstetric History: [id number]  Social History   Socioeconomic History  . Marital status: Married    Spouse name: Not on file  . Number of children: Not on file  . Years of education: Not on file  . Highest education level: Not on file  Occupational History  . Not on file  Social Needs  . Financial resource strain: Not on file  . Food insecurity    Worry: Not on file    Inability: Not on file  . Transportation needs    Medical: Not on file    Non-medical: Not on file  Tobacco Use  . Smoking status: Never Smoker  . Smokeless tobacco: Never Used  Substance and Sexual Activity  . Alcohol use: No  . Drug use: No  . Sexual activity: Yes    Birth control/protection: Pill  Lifestyle  . Physical activity    Days per week: 4 days    Minutes per session: Not on file  . Stress: Not on file  Relationships  . Social Musicianconnections    Talks on phone: Not on file    Gets together: Not on file    Attends religious service: Not on file    Active member of club or organization: Not on file    Attends meetings of clubs or organizations: Not on file    Relationship status: Not on file  .  Intimate partner violence    Fear of current or ex partner: Not on file    Emotionally abused: Not on file    Physically abused: Not on file    Forced sexual activity: Not on file  Other Topics Concern  . Not on file  Social History Narrative  . Not on file    Family History  Problem Relation Age of Onset  . Breast cancer Mother 38        again at 67  . Breast cancer Maternal Aunt 60  . Liver cancer Maternal Grandfather   . Breast cancer Maternal Aunt 49  . Breast cancer Maternal Grandmother        mat great gm    Review of Systems  Constitutional: Negative.   HENT: Negative.   Eyes: Negative.   Respiratory: Negative.   Cardiovascular: Negative.   Gastrointestinal: Negative.   Genitourinary: Negative.   Musculoskeletal: Negative.   Skin: Negative.    Neurological: Negative.   Psychiatric/Behavioral: Negative.      Physical Exam BP 118/74   Ht 5\' 7"  (1.702 m)   Wt 123 lb (55.8 kg)   LMP 05/17/2019   BMI 19.26 kg/m    Physical Exam Constitutional:      General: She is not in acute distress.    Appearance: Normal appearance. She is well-developed.  Genitourinary:     Pelvic exam was performed with patient supine.     Vulva, urethra, bladder and uterus normal.     No inguinal adenopathy present in the right or left side.    No signs of injury in the vagina.     No vaginal discharge, erythema, tenderness or bleeding.     No cervical motion tenderness, discharge, lesion or polyp.     Uterus is mobile.     Uterus is not enlarged or tender.     No uterine mass detected.    Uterus is anteverted.     No right or left adnexal mass present.     Right adnexa not tender or full.     Left adnexa not tender or full.  HENT:     Head: Normocephalic and atraumatic.  Eyes:     General: No scleral icterus.    Conjunctiva/sclera: Conjunctivae normal.  Neck:     Musculoskeletal: Normal range of motion and neck supple.     Thyroid: No thyromegaly.  Cardiovascular:     Rate and Rhythm: Normal rate and regular rhythm.     Heart sounds: No murmur. No friction rub. No gallop.   Pulmonary:     Effort: Pulmonary effort is normal. No respiratory distress.     Breath sounds: Normal breath sounds. No wheezing or rales.  Chest:     Breasts:        Right: No inverted nipple, mass, nipple discharge, skin change or tenderness.        Left: No inverted nipple, mass, nipple discharge, skin change or tenderness.  Abdominal:     General: Bowel sounds are normal. There is no distension.     Palpations: Abdomen is soft. There is no mass.     Tenderness: There is no abdominal tenderness. There is no guarding or rebound.  Musculoskeletal: Normal range of motion.        General: No swelling or tenderness.  Lymphadenopathy:     Cervical: No cervical  adenopathy.     Lower Body: No right inguinal adenopathy. No left inguinal adenopathy.  Neurological:  General: No focal deficit present.     Mental Status: She is alert and oriented to person, place, and time.     Cranial Nerves: No cranial nerve deficit.  Skin:    General: Skin is warm and dry.     Findings: No erythema or rash.  Psychiatric:        Mood and Affect: Mood normal.        Behavior: Behavior normal.        Judgment: Judgment normal.     Female chaperone present for pelvic and breast  portions of the physical exam  Results: AUDIT Questionnaire (screen for alcoholism): 0 PHQ-9: 0   Assessment: 43 y.o. G45P2002 female here for routine annual gynecologic examination  Plan: Problem List Items Addressed This Visit      Other   Excess, menstruation   Relevant Medications   norethindrone-ethinyl estradiol (JUNEL FE 1/20) 1-20 MG-MCG tablet   Family history of breast cancer    Other Visit Diagnoses    Women's annual routine gynecological examination    -  Primary   Relevant Medications   norethindrone-ethinyl estradiol (JUNEL FE 1/20) 1-20 MG-MCG tablet   Screening for depression       Screening for alcoholism       Encounter for surveillance of contraceptive pills       Relevant Medications   norethindrone-ethinyl estradiol (JUNEL FE 1/20) 1-20 MG-MCG tablet      Screening: -- Blood pressure screen normal -- Weight screening: normal -- Depression screening negative (PHQ-9) -- Nutrition: normal -- cholesterol screening: not due for screening -- osteoporosis screening: not due -- tobacco screening: not using -- alcohol screening: AUDIT questionnaire indicates low-risk usage. -- family history of breast cancer screening: done. not at high risk. -- no evidence of domestic violence or intimate partner violence. -- STD screening: gonorrhea/chlamydia NAAT not collected per patient request. -- pap smear not collected per ASCCP guidelines -- flu vaccine  declines -- HPV vaccination series: not eligilbe  Family History of breast cancer: Tyrer Cuzick score 40.5% lifetime. Discussed recommended surveillance and testing for genetic issues. She has no insurance. I did recommend twice yearly clinical breast exams, yearly mammography, and yearly MRI.   15 minutes spent in face to face discussion with > 50% spent in counseling,management, and coordination of care of her family history of breast cancer, including time to calculate her Rockne Menghini score and discuss recommendations.  This time in addition to her routine gynecologic visit.   Thomasene Mohair, MD 06/17/2019 9:20 AM

## 2020-02-18 ENCOUNTER — Other Ambulatory Visit: Payer: Self-pay

## 2020-02-18 ENCOUNTER — Ambulatory Visit (INDEPENDENT_AMBULATORY_CARE_PROVIDER_SITE_OTHER): Payer: Self-pay | Admitting: Obstetrics and Gynecology

## 2020-02-18 ENCOUNTER — Encounter: Payer: Self-pay | Admitting: Obstetrics and Gynecology

## 2020-02-18 VITALS — BP 118/74 | Ht 68.0 in | Wt 126.0 lb

## 2020-02-18 DIAGNOSIS — N920 Excessive and frequent menstruation with regular cycle: Secondary | ICD-10-CM

## 2020-02-18 DIAGNOSIS — Z803 Family history of malignant neoplasm of breast: Secondary | ICD-10-CM

## 2020-02-18 MED ORDER — NORGESTIMATE-ETH ESTRADIOL 0.25-35 MG-MCG PO TABS: 1.0000 | ORAL_TABLET | Freq: Every day | ORAL | 3 refills | Status: DC

## 2020-02-18 NOTE — Progress Notes (Signed)
Obstetrics & Gynecology Office Visit   Chief Complaint  Patient presents with  . Follow-up    breast exam follow up    History of Present Illness: 44 y.o. G33P2002 female with family history of breast cancer and a Tyrer Cuzik lifetime risk of 40.5%. NCCN recommendations have previously been discussed with her. She had no insurance at the time. However, we will have her have twice yearly breast exams.  Her last mammogram was in 2019 and was normal.  She denies breast symptoms today. She still has no insurance  Her menses have become abnormal for the past four months.  They have usually occurred during the wrong week in the pack. This past month she had some brown bleeding mid cycle and her period came a week early.  She has no other symptoms. Prior to this, she had no issues since her last visit.    Past Medical History:  Diagnosis Date  . Family history of breast cancer 02/2017   qualifies for genetic testing when has insurance    Past Surgical History:  Procedure Laterality Date  . NO PAST SURGERIES      Gynecologic History: Patient's last menstrual period was 02/06/2020.  Obstetric History: [id number]  Family History  Problem Relation Age of Onset  . Breast cancer Mother 27        again at 18  . Breast cancer Maternal Aunt 50  . Liver cancer Maternal Grandfather   . Breast cancer Maternal Aunt 50  . Breast cancer Maternal Grandmother        mat great gm    Social History   Socioeconomic History  . Marital status: Married    Spouse name: Not on file  . Number of children: Not on file  . Years of education: Not on file  . Highest education level: Not on file  Occupational History  . Not on file  Tobacco Use  . Smoking status: Never Smoker  . Smokeless tobacco: Never Used  Vaping Use  . Vaping Use: Never used  Substance and Sexual Activity  . Alcohol use: No  . Drug use: No  . Sexual activity: Yes    Birth control/protection: Pill  Other Topics Concern  .  Not on file  Social History Narrative  . Not on file   Social Determinants of Health   Financial Resource Strain:   . Difficulty of Paying Living Expenses:   Food Insecurity:   . Worried About Programme researcher, broadcasting/film/video in the Last Year:   . Barista in the Last Year:   Transportation Needs:   . Freight forwarder (Medical):   Marland Kitchen Lack of Transportation (Non-Medical):   Physical Activity:   . Days of Exercise per Week:   . Minutes of Exercise per Session:   Stress:   . Feeling of Stress :   Social Connections:   . Frequency of Communication with Friends and Family:   . Frequency of Social Gatherings with Friends and Family:   . Attends Religious Services:   . Active Member of Clubs or Organizations:   . Attends Banker Meetings:   Marland Kitchen Marital Status:   Intimate Partner Violence:   . Fear of Current or Ex-Partner:   . Emotionally Abused:   Marland Kitchen Physically Abused:   . Sexually Abused:     No Known Allergies  Prior to Admission medications   Medication Sig Start Date End Date Taking? Authorizing Provider  CALCIUM PO Take by mouth.  [provider]  cholecalciferol (VITAMIN D) 1000 units tablet Take by mouth.    [provider]  norethindrone-ethinyl estradiol (JUNEL FE 1/20) 1-20 MG-MCG tablet Take 1 tablet by mouth daily. 06/17/19   Conard Novak, MD  Polyethylene Glycol 3350 GRAN Take by mouth.    [provider]  vitamin B-12 (CYANOCOBALAMIN) 500 MCG tablet     [provider]  vitamin C (ASCORBIC ACID) 500 MG tablet     [provider]    Review of Systems  Constitutional: Negative.   HENT: Negative.   Eyes: Negative.   Respiratory: Negative.   Cardiovascular: Negative.   Gastrointestinal: Negative.   Genitourinary: Negative.   Musculoskeletal: Negative.   Skin: Negative.   Neurological: Negative.   Psychiatric/Behavioral: Negative.      Physical Exam BP 118/74   Ht 5\' 8"  (1.727 m)   Wt 126 lb  (57.2 kg)   LMP 02/06/2020   BMI 19.16 kg/m  Patient's last menstrual period was 02/06/2020. Physical Exam Constitutional:      General: She is not in acute distress.    Appearance: Normal appearance.  HENT:     Head: Normocephalic and atraumatic.  Eyes:     General: No scleral icterus.    Conjunctiva/sclera: Conjunctivae normal.  Chest:     Breasts: Tanner Score is 5.        Right: No swelling, bleeding, inverted nipple, mass, nipple discharge, skin change or tenderness.        Left: No swelling, bleeding, inverted nipple, mass, nipple discharge, skin change or tenderness.  Lymphadenopathy:     Upper Body:     Right upper body: No supraclavicular, axillary or pectoral adenopathy.     Left upper body: No supraclavicular, axillary or pectoral adenopathy.  Neurological:     General: No focal deficit present.     Mental Status: She is alert and oriented to person, place, and time.     Cranial Nerves: No cranial nerve deficit.  Psychiatric:        Mood and Affect: Mood normal.        Behavior: Behavior normal.        Judgment: Judgment normal.     Female chaperone present for pelvic and breast  portions of the physical exam  Assessment: 44 y.o. G26P2002 female here for  1. Family history of breast cancer   2. Menorrhagia with regular cycle      Plan: Problem List Items Addressed This Visit      Other   Excess, menstruation   Relevant Medications   norgestimate-ethinyl estradiol (SPRINTEC 28) 0.25-35 MG-MCG tablet   Family history of breast cancer - Primary     Continue with every six months surveillance of breast cancer. I have contact [facility] to see if she can get help with other surveillance (mammography, MRI, etc.).   Menorrhagia: Will increase estrogen dose and try sprintec.  More definitive workup if symptoms persist or worsen.   04-21-1999, MD 02/18/2020 11:37 AM

## 2020-02-18 NOTE — Progress Notes (Addendum)
Received request from Dr. Jean Rosenthal to schedule  patient for mammogram through [facility].  She is uninsured at this time. Left message for patient to call to schedule.  Per Dr. Donnella Sham Cuzik score is 40.5% lifetime risk. Spoke to patient.  She is not eligible for [facility].  Referred to Deneice Breedlove to assess [employer] eligibility.

## 2020-02-25 ENCOUNTER — Other Ambulatory Visit: Payer: Self-pay | Admitting: Obstetrics and Gynecology

## 2020-02-25 DIAGNOSIS — Z1231 Encounter for screening mammogram for malignant neoplasm of breast: Secondary | ICD-10-CM

## 2020-03-02 ENCOUNTER — Ambulatory Visit
Admission: RE | Admit: 2020-03-02 | Discharge: 2020-03-02 | Disposition: A | Discharge status: Home / Self Care | Payer: Self-pay | Source: Ambulatory Visit | Attending: Obstetrics and Gynecology | Admitting: Obstetrics and Gynecology

## 2020-03-02 ENCOUNTER — Other Ambulatory Visit: Payer: Self-pay

## 2020-03-02 DIAGNOSIS — Z1231 Encounter for screening mammogram for malignant neoplasm of breast: Secondary | ICD-10-CM | POA: Insufficient documentation

## 2020-11-09 ENCOUNTER — Encounter: Payer: Self-pay | Admitting: Obstetrics and Gynecology

## 2020-11-09 ENCOUNTER — Ambulatory Visit (INDEPENDENT_AMBULATORY_CARE_PROVIDER_SITE_OTHER): Payer: Self-pay | Admitting: Obstetrics and Gynecology

## 2020-11-09 ENCOUNTER — Other Ambulatory Visit: Payer: Self-pay

## 2020-11-09 VITALS — BP 123/66 | Ht 68.0 in | Wt 126.0 lb

## 2020-11-09 DIAGNOSIS — Z803 Family history of malignant neoplasm of breast: Secondary | ICD-10-CM

## 2020-11-09 DIAGNOSIS — Z01419 Encounter for gynecological examination (general) (routine) without abnormal findings: Secondary | ICD-10-CM

## 2020-11-09 DIAGNOSIS — N920 Excessive and frequent menstruation with regular cycle: Secondary | ICD-10-CM

## 2020-11-09 DIAGNOSIS — Z1339 Encounter for screening examination for other mental health and behavioral disorders: Secondary | ICD-10-CM

## 2020-11-09 DIAGNOSIS — Z1331 Encounter for screening for depression: Secondary | ICD-10-CM

## 2020-11-09 MED ORDER — NORGESTIMATE-ETH ESTRADIOL 0.25-35 MG-MCG PO TABS: 1.0000 | ORAL_TABLET | Freq: Every day | ORAL | 4 refills | Status: DC

## 2020-11-09 NOTE — Addendum Note (Signed)
Addended by: Thomasene Mohair D on: 11/09/2020 10:38 AM   Modules accepted: Orders

## 2020-11-09 NOTE — Progress Notes (Addendum)
Gynecology Annual Exam  PCP: Trey Sailors, PA-C  Chief Complaint  Patient presents with  . Annual Exam   History of Present Illness:  [name] is a 45 y.o. [id number] who LMP was Patient's last menstrual period was 10/24/2020., presents today for her annual examination.  Her menses are regular with Sprintec. The periods last about 7 days. Her periods are heavier than in the past. Denies dysmenorrhea.  She occasionally gets a headache, she states they are not like migraines.   She is sexually active. No issues with intercourse.  Last Pap: 3  Years ago.  Results were: no abnormalities /neg HPV DNA negative Hx of STDs: none  Her mother had two primary breast cancers (in her 29's and 83s), and her great grandmother had breast cancer. There is no FH of ovarian cancer. The patient does do self-breast exams. There is no FH of ovarian cancer.   Tyrer-Cuzick score: 40.5% lifetime.  She states her mother was tested 40 years ago and states that her testing was negative.    Tobacco use: The patient denies current or previous tobacco use. Alcohol use: none Exercise: very active  The patient wears seatbelts: yes.   The patient reports that domestic violence in her life is absent.   Past Medical History:  Diagnosis Date  . Family history of breast cancer 02/2017   qualifies for genetic testing when has insurance    Past Surgical History:  Procedure Laterality Date  . NO PAST SURGERIES      Prior to Admission medications   Medication Sig Start Date End Date Taking? Authorizing Provider  CALCIUM PO Take by mouth.   Yes [provider]  cholecalciferol (VITAMIN D) 1000 units tablet Take by mouth.   Yes [provider]  vitamin B-12 (CYANOCOBALAMIN) 500 MCG tablet    Yes [provider]  vitamin C (ASCORBIC ACID) 500 MG tablet    Yes [provider]  Polyethylene Glycol 3350 GRAN Take by mouth.    [provider]  Sprintec  Allergies:  No Known Allergies  Obstetric History: [id number]  Social History   Socioeconomic History  . Marital status: Married    Spouse name: Not on file  . Number of children: Not on file  . Years of education: Not on file  . Highest education level: Not on file  Occupational History  . Not on file  Tobacco Use  . Smoking status: Never Smoker  . Smokeless tobacco: Never Used  Vaping Use  . Vaping Use: Never used  Substance and Sexual Activity  . Alcohol use: No  . Drug use: No  . Sexual activity: Yes    Birth control/protection: Pill  Other Topics Concern  . Not on file  Social History Narrative  . Not on file   Social Determinants of Health   Financial Resource Strain: Not on file  Food Insecurity: Not on file  Transportation Needs: Not on file  Physical Activity: Not on file  Stress: Not on file  Social Connections: Not on file  Intimate Partner Violence: Not on file    Family History  Problem Relation Age of Onset  . Breast cancer Mother 38        again at 62  . Breast cancer Maternal Aunt 50  . Liver cancer Maternal Grandfather   . Breast cancer Maternal Aunt 50  . Breast cancer Maternal Grandmother        mat great gm    Review of  Systems  Constitutional: Negative.   HENT: Negative.   Eyes: Negative.   Respiratory: Negative.   Cardiovascular: Negative.   Gastrointestinal: Negative.   Genitourinary: Negative.   Musculoskeletal: Negative.   Skin: Negative.   Neurological: Negative.   Psychiatric/Behavioral: Negative.      Physical Exam BP 123/66   Ht 5\' 8"  (1.727 m)   Wt 126 lb (57.2 kg)   LMP 10/24/2020   BMI 19.16 kg/m    Physical Exam Constitutional:      General: She is not in acute distress.    Appearance: Normal appearance. She is well-developed.  Genitourinary:     Vulva and bladder normal.     No vaginal discharge, erythema, tenderness or bleeding.      Right Adnexa: not tender, not full and no mass present.    Left Adnexa: not tender,  not full and no mass present.    No cervical motion tenderness, discharge, lesion or polyp.     Uterus is not enlarged or tender.     No uterine mass detected. Breasts:     Right: No inverted nipple, mass, nipple discharge, skin change or tenderness.     Left: No inverted nipple, mass, nipple discharge, skin change or tenderness.    HENT:     Head: Normocephalic and atraumatic.  Eyes:     General: No scleral icterus.    Conjunctiva/sclera: Conjunctivae normal.  Neck:     Thyroid: No thyromegaly.  Cardiovascular:     Rate and Rhythm: Normal rate and regular rhythm.     Heart sounds: No murmur heard. No friction rub. No gallop.   Pulmonary:     Effort: Pulmonary effort is normal. No respiratory distress.     Breath sounds: Normal breath sounds. No wheezing or rales.  Abdominal:     General: Bowel sounds are normal. There is no distension.     Palpations: Abdomen is soft. There is no mass.     Tenderness: There is no abdominal tenderness. There is no guarding or rebound.  Musculoskeletal:        General: No swelling or tenderness. Normal range of motion.     Cervical back: Normal range of motion and neck supple.  Lymphadenopathy:     Cervical: No cervical adenopathy.     Lower Body: No right inguinal adenopathy. No left inguinal adenopathy.  Neurological:     General: No focal deficit present.     Mental Status: She is alert and oriented to person, place, and time.     Cranial Nerves: No cranial nerve deficit.  Skin:    General: Skin is warm and dry.     Findings: No erythema or rash.  Psychiatric:        Mood and Affect: Mood normal.        Behavior: Behavior normal.        Judgment: Judgment normal.     Female chaperone present for pelvic and breast  portions of the physical exam  Results: AUDIT Questionnaire (screen for alcoholism): 0 PHQ-9: 0   Assessment: 45 y.o. G28P2002 female here for routine annual gynecologic examination  Plan: Problem List Items  Addressed This Visit      Other   Excess, menstruation   Relevant Medications   norgestimate-ethinyl estradiol (SPRINTEC 28) 0.25-35 MG-MCG tablet   Family history of breast cancer    Other Visit Diagnoses    Women's annual routine gynecological examination    -  Primary   Screening for depression  Screening for alcoholism          Screening: -- Blood pressure screen normal -- colorectal cancer screening: discussed. She declines today.  -- Weight screening: normal -- Depression screening negative (PHQ-9) -- Nutrition: normal -- cholesterol screening: not due for screening -- osteoporosis screening: not due -- tobacco screening: not using -- alcohol screening: AUDIT questionnaire indicates low-risk usage. -- family history of breast cancer screening: done. not at high risk. -- no evidence of domestic violence or intimate partner violence. -- STD screening: gonorrhea/chlamydia NAAT not collected per patient request. -- pap smear not collected per ASCCP guidelines -- flu vaccine declines -- HPV vaccination series: not eligilbe  Family History of breast cancer: Tyrer Cuzick score 40.5% lifetime. Discussed recommended surveillance and testing for genetic issues. She has no insurance. I did recommend twice yearly clinical breast exams, yearly mammography, and yearly MRI.     Thomasene Mohair, MD 11/09/2020 10:37 AM

## 2021-04-03 ENCOUNTER — Other Ambulatory Visit: Payer: Self-pay | Admitting: Obstetrics and Gynecology

## 2021-04-03 ENCOUNTER — Telehealth: Payer: Self-pay

## 2021-04-03 DIAGNOSIS — Z803 Family history of malignant neoplasm of breast: Secondary | ICD-10-CM

## 2021-04-03 DIAGNOSIS — Z1231 Encounter for screening mammogram for malignant neoplasm of breast: Secondary | ICD-10-CM

## 2021-04-03 NOTE — Telephone Encounter (Signed)
Pt calling; has talked c Blima Singer and has been approved for the [employer] to help with her mammogram expense; please put in order; if we schedule the mammogram, pt prefers morning from 9-10:30; is not able to do 04/19/21 as she has another appt that day.  Another option is let Dewayne Hatch know the order is in and she will schedule the appt (she also knows the specifics).  [phone]

## 2021-04-26 ENCOUNTER — Other Ambulatory Visit: Payer: Self-pay

## 2021-04-26 ENCOUNTER — Other Ambulatory Visit: Payer: Self-pay | Admitting: Pediatrics

## 2021-04-26 ENCOUNTER — Ambulatory Visit
Admission: RE | Admit: 2021-04-26 | Discharge: 2021-04-26 | Disposition: A | Discharge status: Home / Self Care | Payer: Self-pay | Source: Ambulatory Visit | Attending: Pediatrics | Admitting: Pediatrics

## 2021-04-26 ENCOUNTER — Ambulatory Visit
Admission: RE | Admit: 2021-04-26 | Discharge: 2021-04-26 | Disposition: A | Discharge status: Home / Self Care | Payer: Self-pay | Source: Ambulatory Visit | Attending: Obstetrics and Gynecology | Admitting: Obstetrics and Gynecology

## 2021-04-26 DIAGNOSIS — R053 Chronic cough: Secondary | ICD-10-CM

## 2021-04-26 DIAGNOSIS — Z1231 Encounter for screening mammogram for malignant neoplasm of breast: Secondary | ICD-10-CM | POA: Insufficient documentation

## 2021-04-26 DIAGNOSIS — Z803 Family history of malignant neoplasm of breast: Secondary | ICD-10-CM | POA: Insufficient documentation

## 2021-04-26 IMAGING — MG MM DIGITAL SCREENING BILAT W/ TOMO AND CAD
8 series · 9 of 24 positions shown · non-contrast
Comparison: Previous exam(s).

CLINICAL DATA: Screening.

EXAM:
DIGITAL SCREENING BILATERAL MAMMOGRAM WITH TOMOSYNTHESIS AND CAD
TECHNIQUE: Bilateral screening digital craniocaudal and mediolateral oblique
mammograms were obtained. Bilateral screening digital breast
tomosynthesis was performed. The images were evaluated with
computer-aided detection.

[R CC synth-2D]
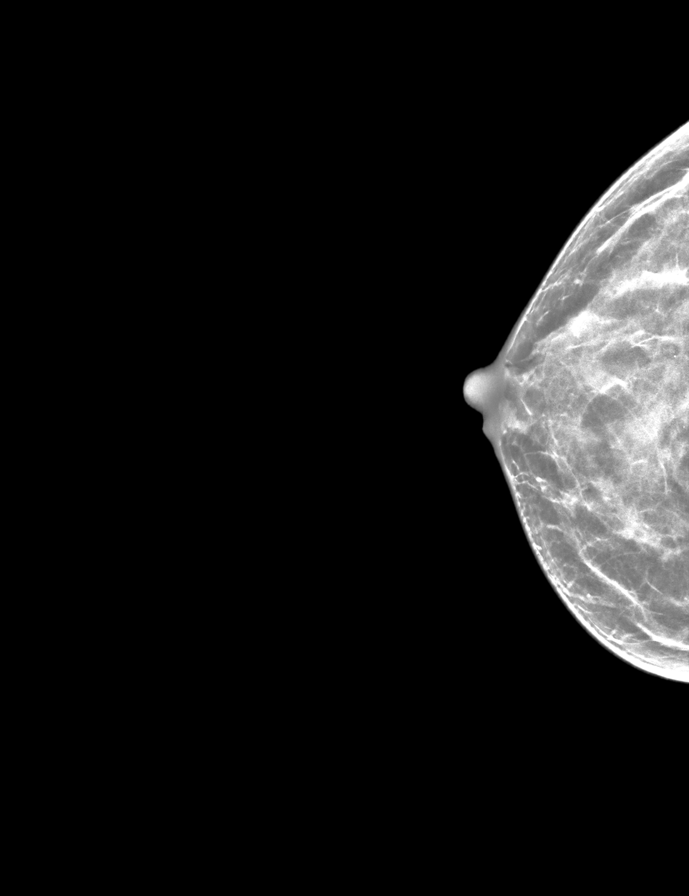

[L MLO synth-2D]
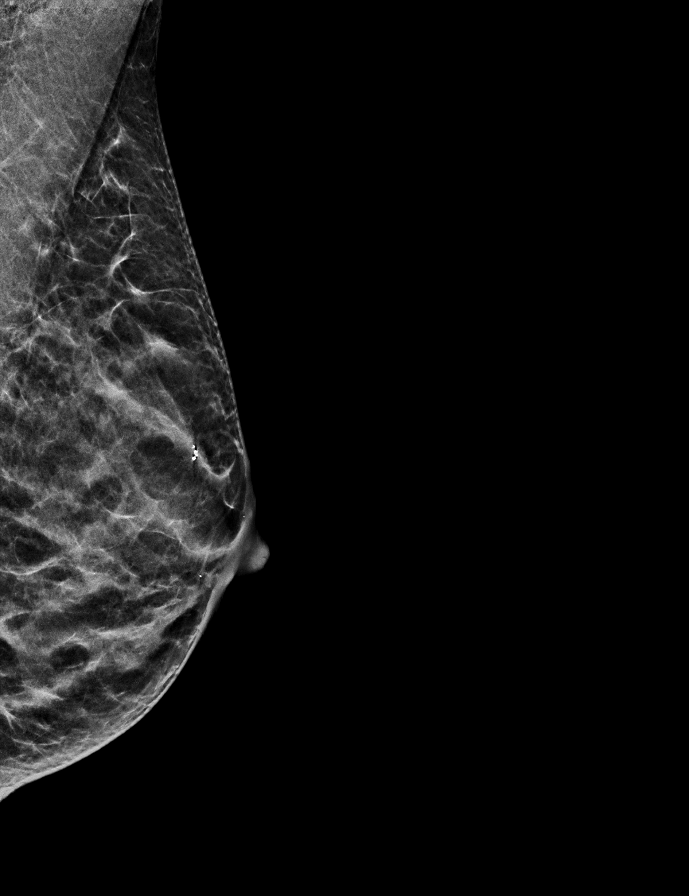

[L CC synth-2D]
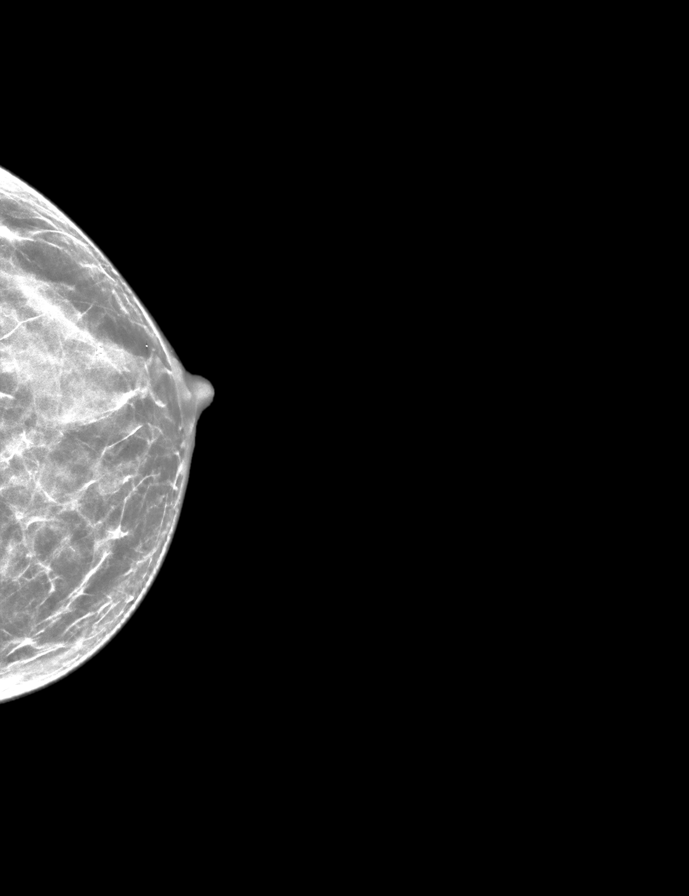

[R MLO synth-2D]
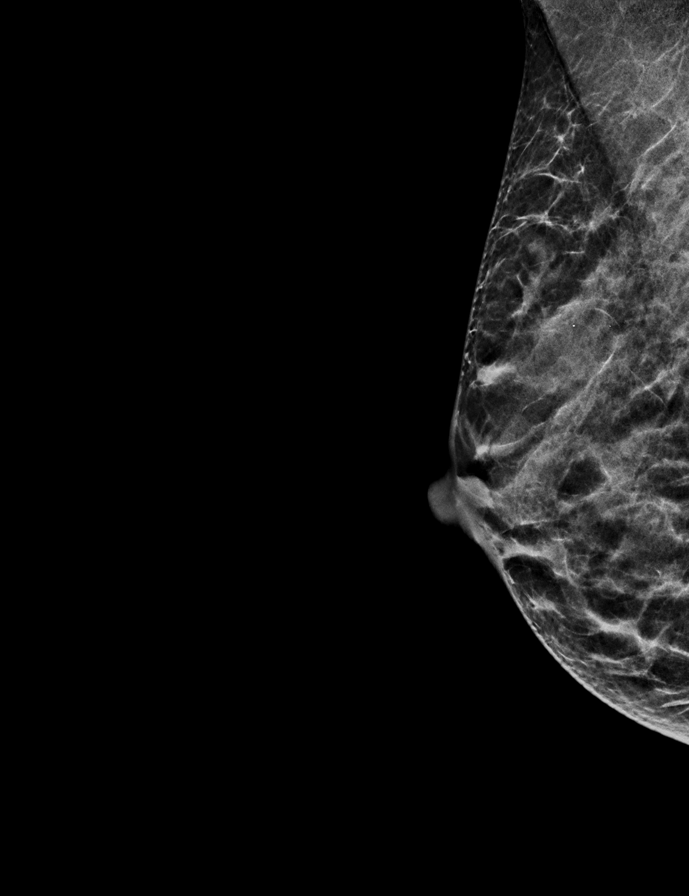

[R MLO tomo · 2 of 41 frames shown]
[frame 14/41]
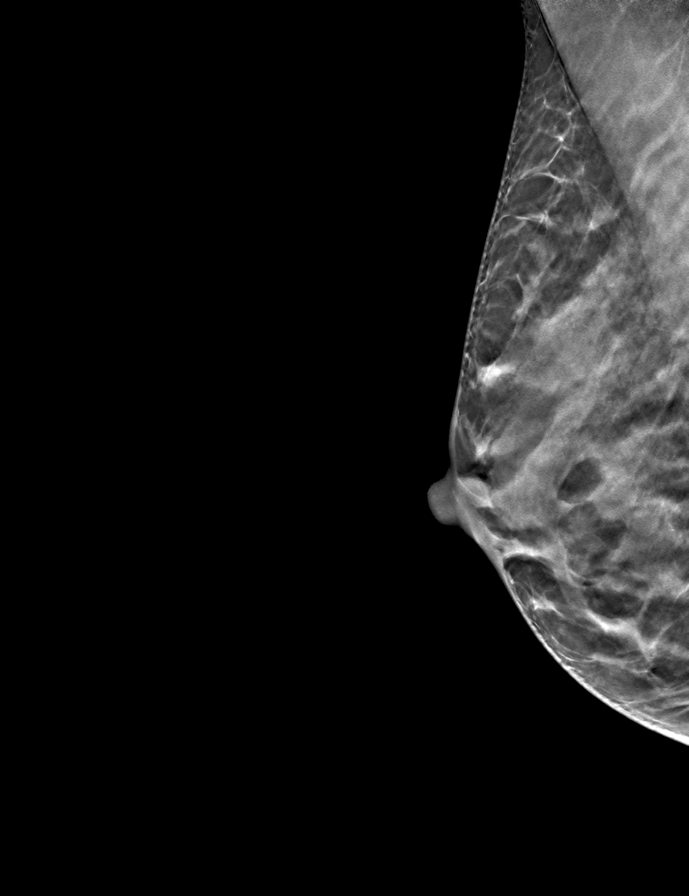
[frame 21/41]
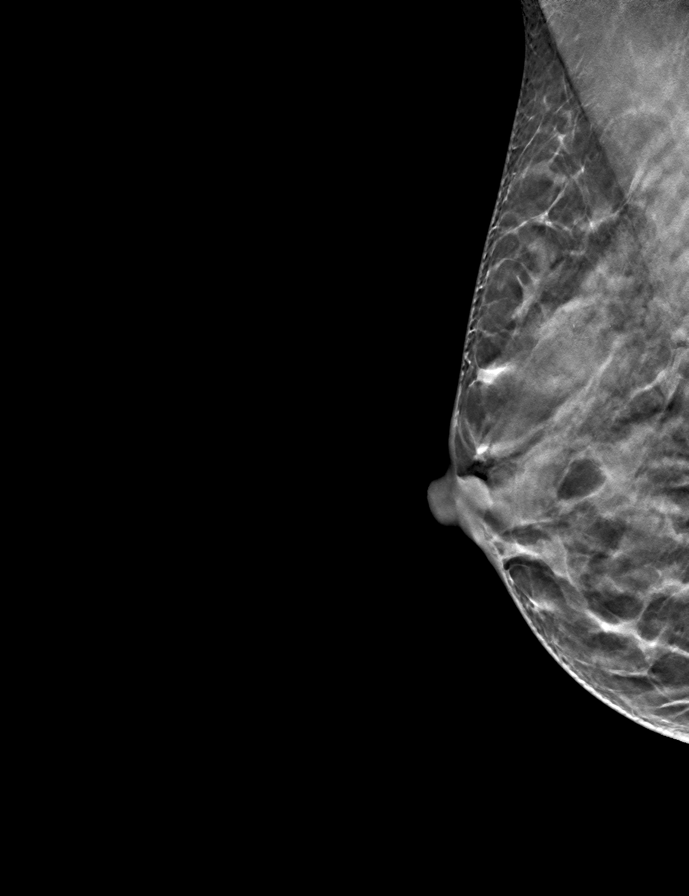

[L CC tomo · tomo slice 26/51.0]
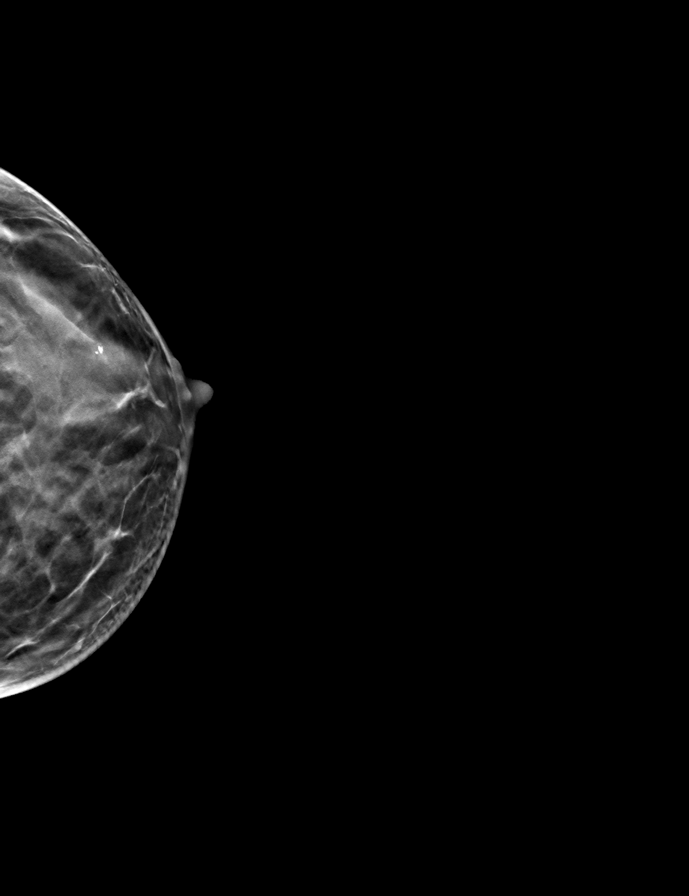

[R CC tomo · tomo slice 25/49.0]
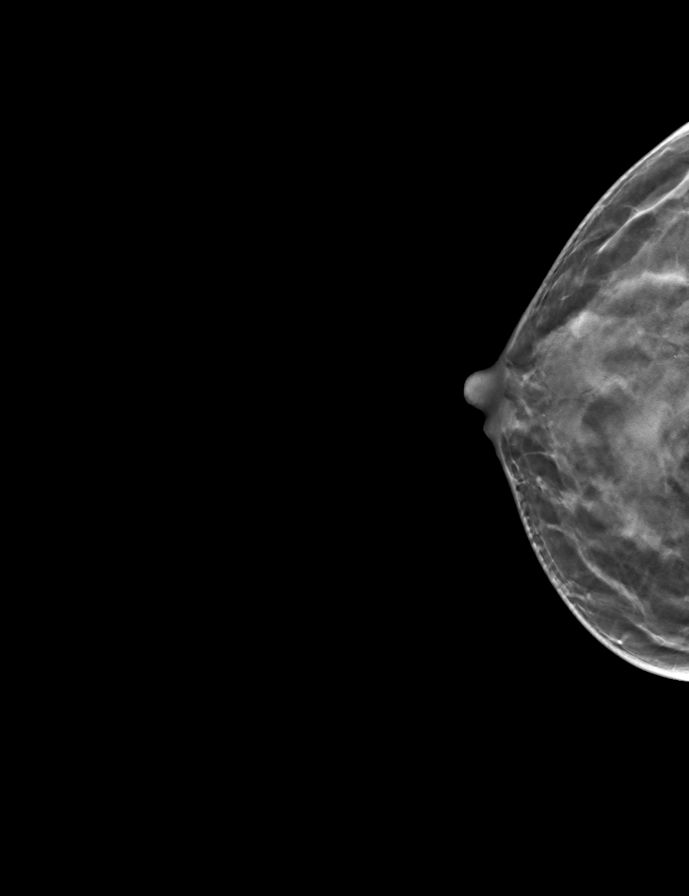

[L MLO tomo · tomo slice 21/41.0]
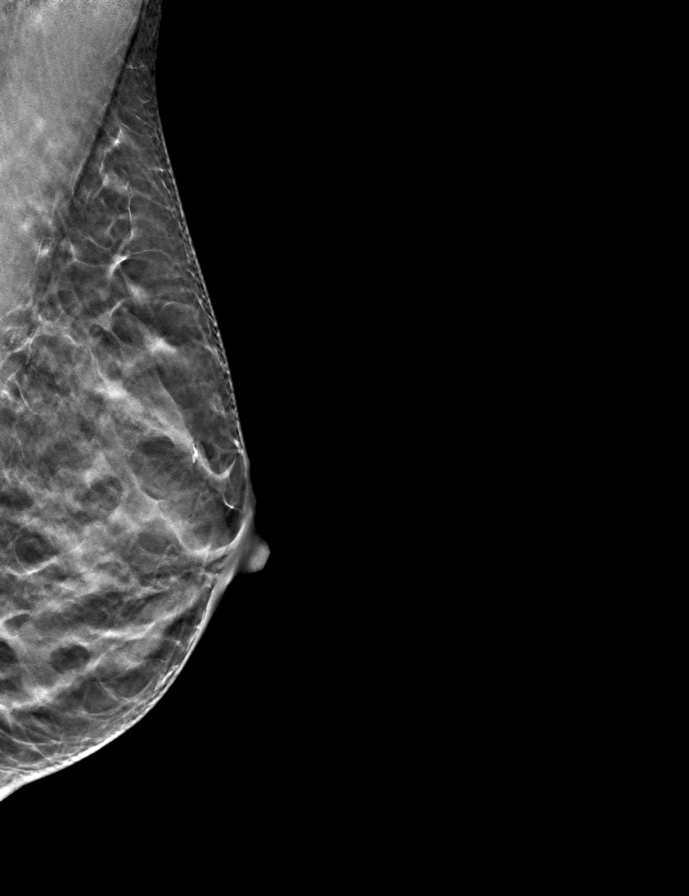

[9 of 24 positions shown; findings below may reference images not displayed]

ACR Breast Density Category d: The breast tissue is extremely dense,
which lowers the sensitivity of mammography
FINDINGS: There are no findings suspicious for malignancy.
IMPRESSION: No mammographic evidence of malignancy. A result letter of this
screening mammogram will be mailed directly to the patient.

RECOMMENDATION:
Screening mammogram in one year. (Code:[7P])

BI-RADS CATEGORY  1: Negative.

## 2021-05-16 ENCOUNTER — Other Ambulatory Visit: Payer: Self-pay

## 2021-05-16 ENCOUNTER — Encounter: Payer: Self-pay | Admitting: Obstetrics and Gynecology

## 2021-05-16 ENCOUNTER — Ambulatory Visit (INDEPENDENT_AMBULATORY_CARE_PROVIDER_SITE_OTHER): Payer: Self-pay | Admitting: Obstetrics and Gynecology

## 2021-05-16 VITALS — BP 115/69 | Ht 67.0 in | Wt 126.2 lb

## 2021-05-16 DIAGNOSIS — Z803 Family history of malignant neoplasm of breast: Secondary | ICD-10-CM

## 2021-05-16 NOTE — Progress Notes (Signed)
[facility]  Obstetrics & Gynecology Office Visit   Chief Complaint  Patient presents with   Follow-up    Breast exam.    History of Present Illness: 45 y.o. G68P2002 female with family history of breast cancer and a Tyrer Cuzik lifetime risk of 40.5%. NCCN recommendations have previously been discussed with her. She had no insurance at the time. However, we will have her have twice yearly breast exams.  Her last mammogram was in 04/2021 and was normal.  She denies breast symptoms today. She still has no insurance  Her menses have become abnormal for the past four months.  They have usually occurred during the wrong week in the pack. This past month she had some brown bleeding mid cycle and her period came a week early.  She has no other symptoms. Prior to this, she had no issues since her last visit.    Past Medical History:  Diagnosis Date   Family history of breast cancer 02/2017   qualifies for genetic testing when has insurance    Past Surgical History:  Procedure Laterality Date   NO PAST SURGERIES      Gynecologic History: Patient's last menstrual period was 05/08/2021.  Obstetric History: [id number]  Family History  Problem Relation Age of Onset   Breast cancer Mother 72        again at 43   Macular degeneration Father    Breast cancer Maternal Aunt 54   Breast cancer Maternal Aunt 50   Breast cancer Maternal Grandmother        mat great gm   Liver cancer Maternal Grandfather     Social History   Socioeconomic History   Marital status: Married    Spouse name: Not on file   Number of children: Not on file   Years of education: Not on file   Highest education level: Not on file  Occupational History   Not on file  Tobacco Use   Smoking status: Never   Smokeless tobacco: Never  Vaping Use   Vaping Use: Never used  Substance and Sexual Activity   Alcohol use: No   Drug use: No   Sexual activity: Yes    Birth control/protection: Pill  Other Topics Concern   Not  on file  Social History Narrative   Not on file   Social Determinants of Health   Financial Resource Strain: Not on file  Food Insecurity: Not on file  Transportation Needs: Not on file  Physical Activity: Not on file  Stress: Not on file  Social Connections: Not on file  Intimate Partner Violence: Not on file    No Known Allergies  Prior to Admission medications   Medication Sig Start Date End Date Taking? Authorizing Provider  CALCIUM PO Take by mouth.    [provider]  cholecalciferol (VITAMIN D) 1000 units tablet Take by mouth.    [provider]  norethindrone-ethinyl estradiol (JUNEL FE 1/20) 1-20 MG-MCG tablet Take 1 tablet by mouth daily. 06/17/19   Conard Novak, MD  Polyethylene Glycol 3350 GRAN Take by mouth.    [provider]  vitamin B-12 (CYANOCOBALAMIN) 500 MCG tablet     [provider]  vitamin C (ASCORBIC ACID) 500 MG tablet     [provider]    Review of Systems  Constitutional: Negative.   HENT: Negative.    Eyes: Negative.   Respiratory: Negative.    Cardiovascular: Negative.   Gastrointestinal: Negative.   Genitourinary: Negative.  Musculoskeletal: Negative.   Skin: Negative.   Neurological: Negative.   Psychiatric/Behavioral: Negative.      Physical Exam BP 115/69   Ht 5\' 7"  (1.702 m)   Wt 126 lb 3.2 oz (57.2 kg)   LMP 05/08/2021   BMI 19.77 kg/m  Patient's last menstrual period was 05/08/2021. Physical Exam Constitutional:      General: She is not in acute distress.    Appearance: Normal appearance.  Genitourinary:  Breasts:    Tanner Score is 5.     Right: No swelling, bleeding, inverted nipple, mass, nipple discharge, skin change or tenderness.     Left: No swelling, bleeding, inverted nipple, mass, nipple discharge, skin change or tenderness.  HENT:     Head: Normocephalic and atraumatic.  Eyes:     General: No scleral icterus.    Conjunctiva/sclera: Conjunctivae normal.   Lymphadenopathy:     Upper Body:     Right upper body: No supraclavicular, axillary or pectoral adenopathy.     Left upper body: No supraclavicular, axillary or pectoral adenopathy.  Neurological:     General: No focal deficit present.     Mental Status: She is alert and oriented to person, place, and time.     Cranial Nerves: No cranial nerve deficit.  Psychiatric:        Mood and Affect: Mood normal.        Behavior: Behavior normal.        Judgment: Judgment normal.    Female chaperone present for pelvic and breast  portions of the physical exam  Assessment: 45 y.o. G24P2002 female here for  1. Family history of breast cancer      Plan: Problem List Items Addressed This Visit       Other   Family history of breast cancer - Primary  Continue with every six months surveillance of breast cancer. She is using BCCCP to can get help with other surveillance (mammography, MRI, etc.).   Follow up in 6 months for annual and breast exam.  G1P80, MD 05/16/2021 9:55 AM

## 2022-07-01 ENCOUNTER — Encounter: Payer: Self-pay | Admitting: Internal Medicine

## 2022-07-01 ENCOUNTER — Ambulatory Visit (INDEPENDENT_AMBULATORY_CARE_PROVIDER_SITE_OTHER): Payer: Self-pay

## 2022-07-01 ENCOUNTER — Ambulatory Visit: Payer: Self-pay | Attending: Internal Medicine | Admitting: Internal Medicine

## 2022-07-01 VITALS — BP 123/74 | HR 63 | Resp 14 | Ht 67.0 in | Wt 130.0 lb

## 2022-07-01 DIAGNOSIS — M549 Dorsalgia, unspecified: Secondary | ICD-10-CM | POA: Insufficient documentation

## 2022-07-01 DIAGNOSIS — Z8269 Family history of other diseases of the musculoskeletal system and connective tissue: Secondary | ICD-10-CM

## 2022-07-01 DIAGNOSIS — M24852 Other specific joint derangements of left hip, not elsewhere classified: Secondary | ICD-10-CM

## 2022-07-01 DIAGNOSIS — R768 Other specified abnormal immunological findings in serum: Secondary | ICD-10-CM

## 2022-07-01 LAB — SEDIMENTATION RATE: Sed Rate: 14 mm/h (ref 0–20)

## 2022-07-01 NOTE — Progress Notes (Signed)
Office Visit Note  Patient: Brittany Murray             Date of Birth: 30-Jun-1976           MRN: [id number]             PCP: Trey Sailors, PA-C (Inactive) Referring: Ronne Binning, NP Visit Date: 07/01/2022   Subjective:  New Patient (Initial Visit) (Abnormal labs)   History of Present Illness: Brittany Murray is a 46 y.o. female here for evaluation of abnormal lab results she has had positive direct ANA and mildly elevated C-reactive protein.  This been associated with several new and worsening symptoms especially throughout the past year.  She has had increased pain in the neck and upper back noticing an increased forward hunched and head position but is able to entirely reduce this.  She is having frequent awakening from sleep due to back pain.  She has had fairly diffuse soreness throughout her body often for years but the recent problems are much more noticeable.  She is noticing some swelling in her hands in the mornings and also sees fluid accumulation at her feet more towards the end of the day.  She is particularly concerned about this back pain and joint problems and increased kyphosis posture with a family history of ankylosing spondylitis.  Besides the joint problems she is also noticing some enlarged lymph nodes is also been noticing a palpable lump or nodule under the left arm and on the left side of the neck since earlier this year.  Activities of Daily Living:  Patient reports morning stiffness for 10 minutes.   Patient Reports nocturnal pain.  Difficulty dressing/grooming: Denies Difficulty climbing stairs: Reports Difficulty getting out of chair: Reports Difficulty using hands for taps, buttons, cutlery, and/or writing: Reports  Review of Systems  Constitutional:  Positive for fatigue.  HENT:  Positive for mouth dryness. Negative for mouth sores.   Eyes:  Negative for dryness.  Respiratory:  Positive for shortness of breath.   Cardiovascular:  Positive for  palpitations. Negative for chest pain.  Gastrointestinal:  Positive for constipation. Negative for blood in stool and diarrhea.  Endocrine: Positive for increased urination.  Genitourinary:  Negative for involuntary urination.  Musculoskeletal:  Positive for joint pain, gait problem, joint pain, joint swelling, myalgias, muscle weakness, morning stiffness, muscle tenderness and myalgias.  Skin:  Negative for color change, rash, hair loss and sensitivity to sunlight.  Allergic/Immunologic: Negative for susceptible to infections.  Neurological:  Positive for dizziness and headaches.  Hematological:  Negative for swollen glands.  Psychiatric/Behavioral:  Positive for sleep disturbance. Negative for depressed mood. The patient is nervous/anxious.     PMFS History:  Patient Active Problem List   Diagnosis Date Noted   Positive ANA (antinuclear antibody) 07/01/2022   Upper back pain 07/01/2022   Family history of ankylosing spondylitis 07/01/2022   Family history of breast cancer 02/17/2017   Ache in joint 08/22/2015   Excess, menstruation 08/22/2015   Chronic interstitial cystitis 08/22/2015   Esophagitis, reflux 08/22/2015   Restless leg 08/22/2015    Past Medical History:  Diagnosis Date   Family history of breast cancer 02/2017   qualifies for genetic testing when has insurance    Family History  Problem Relation Age of Onset   Osteoarthritis Mother    Breast cancer Mother 49        again at 25   Osteoporosis Mother    Congestive Heart Failure Mother  Macular degeneration Father    Diabetes Father    Breast cancer Maternal Aunt 92   Breast cancer Maternal Aunt 50   Breast cancer Maternal Grandmother        mat great gm   Liver cancer Maternal Grandfather    Past Surgical History:  Procedure Laterality Date   NO PAST SURGERIES     Social History   Social History Narrative   Not on file    There is no immunization history on file for this patient.    Objective: Vital Signs: BP 118/69 (BP Location: Right Arm, Patient Position: Sitting, Cuff Size: Normal)   Pulse 61   Resp 14   Ht 5\' 7"  (1.702 m)   Wt 130 lb (59 kg)   BMI 20.36 kg/m    Physical Exam HENT:     Right Ear: External ear normal.     Left Ear: External ear normal.     Mouth/Throat:     Mouth: Mucous membranes are moist.     Pharynx: Oropharynx is clear.  Eyes:     Conjunctiva/sclera: Conjunctivae normal.  Cardiovascular:     Rate and Rhythm: Normal rate and regular rhythm.  Pulmonary:     Effort: Pulmonary effort is normal.     Breath sounds: Normal breath sounds.  Musculoskeletal:     Right lower leg: No edema.     Left lower leg: No edema.  Lymphadenopathy:     Cervical: No cervical adenopathy.  Skin:    General: Skin is warm and dry.     Findings: No rash.  Neurological:     Mental Status: She is alert.     Sensory: No sensory deficit.     Motor: No weakness.  Psychiatric:        Mood and Affect: Mood normal.      Musculoskeletal Exam:  Neck restricted left rotation, muscular knot with minimal tenderness left paraspinal mr posterior scalene muscles Shoulders full ROM no tenderness or swelling Elbows full ROM no tenderness or swelling Wrists full ROM no tenderness or swelling Fingers full ROM no tenderness or swelling Upper back paraspinal muscle tenderness to pressure worst along medial border of scapulae, no radiation, mild bilateral low back tenderness and over sacrum Knees full ROM no tenderness or swelling Ankles full ROM no tenderness or swelling   Investigation: No additional findings.  Imaging: No results found.  Recent Labs: No results found for: "WBC", "HGB", "PLT", "NA", "K", "CL", "CO2", "GLUCOSE", "BUN", "CREATININE", "BILITOT", "ALKPHOS", "AST", "ALT", "PROT", "ALBUMIN", "CALCIUM", "GFRAA", "QFTBGOLD", "QFTBGOLDPLUS"  Speciality Comments: No specialty comments available.  Procedures:  No procedures performed Allergies:  Patient has no known allergies.   Assessment / Plan:     Visit Diagnoses: Positive ANA (antinuclear antibody) - Plan: ANA, Sedimentation rate, C-reactive protein  Positive ANA but I do not see obvious clinical criteria for an active connective tissue disease on exam today.  Will recheck ANA for titer and pattern today we will also recheck serum inflammatory markers including the CRP.  With unremarkable x-ray findings if inflammatory labs are not very suggestive would probably recommend considering trial of physical therapy or following up as needed if symptoms change significantly.  Upper back pain  Upper back and neck pain appears to be more muscular than anything some tenderness to pressure in paraspinal areas without significant radiation.  Left hip crepitus Family history of ankylosing spondylitis - Plan: XR Pelvis 1-2 Views  Checking x-ray of pelvis for evidence of sacroiliitis considering low  back pain and family history of ankylosing spondylitis with mildly high inflammatory marker.  I do not see any signal evidence there may be very mild degenerative changes of the SI joints but does not appear significant.  No abnormality of the left hip seen.  Orders: Orders Placed This Encounter  Procedures   XR Pelvis 1-2 Views   ANA   Sedimentation rate   C-reactive protein   No orders of the defined types were placed in this encounter.    Follow-Up Instructions: Return if symptoms worsen or fail to improve.   Fuller Plan, MD  Note - This record has been created using [employer].  Chart creation errors have been sought, but may not always  have been located. Such creation errors do not reflect on  the standard of medical care.

## 2022-07-02 LAB — C-REACTIVE PROTEIN: CRP: 8.9 mg/L — ABNORMAL HIGH (ref <8.0)

## 2022-07-02 LAB — ANA: Anti Nuclear Antibody (ANA): NEGATIVE

## 2022-07-05 NOTE — Progress Notes (Signed)
Lab results look okay on repeat the ANA test is negative.  I am not sure why this would have had a previous positive test result and now become negative possibly had some current reaction or inflammation at the time of previous testing. CRP is very slightly above normal at 8.9 this is about the same as the previous 9.  X-ray of her hips and pelvis does not show evidence of SI joint inflammation and her left hip appears structurally normal.  Based on the reassuring x-ray and the borderline abnormal CRP that has not changed I do not see evidence that we need to be treating for something like AS at this time.  We can probably follow-up as needed if there are progression of symptoms in the next couple months or if she wants could see if any of her back or hip problem improve with trying physical therapy.

## 2022-08-21 ENCOUNTER — Encounter: Payer: Self-pay | Admitting: Internal Medicine

## 2023-01-10 ENCOUNTER — Inpatient Hospital Stay: Payer: Self-pay

## 2023-01-10 ENCOUNTER — Encounter: Payer: Self-pay | Admitting: Internal Medicine

## 2023-01-10 ENCOUNTER — Inpatient Hospital Stay: Payer: Self-pay | Attending: Internal Medicine | Admitting: Internal Medicine

## 2023-01-10 VITALS — BP 126/71 | HR 64 | Temp 98.5°F | Wt 119.6 lb

## 2023-01-10 DIAGNOSIS — N920 Excessive and frequent menstruation with regular cycle: Secondary | ICD-10-CM | POA: Insufficient documentation

## 2023-01-10 DIAGNOSIS — D509 Iron deficiency anemia, unspecified: Secondary | ICD-10-CM | POA: Insufficient documentation

## 2023-01-10 DIAGNOSIS — Z800 Family history of malignant neoplasm of digestive organs: Secondary | ICD-10-CM | POA: Insufficient documentation

## 2023-01-10 DIAGNOSIS — Z803 Family history of malignant neoplasm of breast: Secondary | ICD-10-CM | POA: Insufficient documentation

## 2023-01-10 DIAGNOSIS — N3010 Interstitial cystitis (chronic) without hematuria: Secondary | ICD-10-CM | POA: Insufficient documentation

## 2023-01-10 NOTE — Progress Notes (Signed)
Pt is wanting to know what's causing her to have low iron, more difficult to do normal daily tasks, she has to stop to catch her breath, trouble with fatigue, fogginess.

## 2023-01-10 NOTE — Patient Instructions (Signed)
Ferrex 150 mg (polysaccharide iron) once daily along with vitamin C.

## 2023-01-10 NOTE — Progress Notes (Signed)
[facility]  Telephone:(336) [phone] Fax:(336) [phone]  ID: Corky Mull OB: [birth year]  MR#: [id number]  MWU#:[id number]  Patient Care Team: Trey Sailors, PA-C (Inactive) as PCP - General (Physician Assistant)  REFERRING PROVIDER: Osvaldo Angst  REASON FOR REFERRAL: Iron deficiency anemia  HPI: Brittany Murray is a 47 y.o. female with no significant past medical history was referred to hematology for management of iron deficiency anemia.  Menstrual cycles-every month, midcycle is heavy, she uses heavy pad and tampon and has to change every 1-1 and half hours.  Is on Sprintec OCP. Denies any bleeding in urine or stool.  Last colonoscopy was in 2003 when she was having pelvic pain and was eventually diagnosed with interstitial cystitis.  She is not on any medications.  She has frequency but not bothersome. Denies any gastric bypass surgery.  No excessive NSAID use. Colon cancer.  Left breast cancer in her mother and maternal grandmother and aunt Has a shortness of breath on exertion which is progressive for the past 4 to 6 months.  Feels fatigued.  Has brain fog. She has taken iron pills during pregnancy.  Not otherwise.  Has been taking the flavor supplements for the past year.  Labs reviewed from 01/08/2023.  WBC 5.1, hemoglobin 7.9, MCV 72, platelets 428.  Vitamin B12 603, folic acid 10.1.  Iron 13, saturation 3% and ferritin 3.   REVIEW OF SYSTEMS:   ROS  As per HPI. Otherwise, a complete review of systems is negative.  PAST MEDICAL HISTORY: Past Medical History:  Diagnosis Date   Family history of breast cancer 02/2017   qualifies for genetic testing when has insurance    PAST SURGICAL HISTORY: Past Surgical History:  Procedure Laterality Date   NO PAST SURGERIES      FAMILY HISTORY: Family History  Problem Relation Age of Onset   Osteoarthritis Mother    Breast cancer Mother 1        again at 82   Osteoporosis Mother    Congestive Heart  Failure Mother    Macular degeneration Father    Diabetes Father    Breast cancer Maternal Aunt 8   Breast cancer Maternal Aunt 50   Breast cancer Maternal Grandmother        mat great gm   Liver cancer Maternal Grandfather     HEALTH MAINTENANCE: Social History   Tobacco Use   Smoking status: Never    Passive exposure: Never   Smokeless tobacco: Never  Vaping Use   Vaping Use: Never used  Substance Use Topics   Alcohol use: No   Drug use: No     No Known Allergies  Current Outpatient Medications  Medication Sig Dispense Refill   CALCIUM PO Take by mouth.     cholecalciferol (VITAMIN D) 1000 units tablet Take by mouth.     norgestimate-ethinyl estradiol (SPRINTEC 28) 0.25-35 MG-MCG tablet Take 1 tablet by mouth daily.     Polyethylene Glycol 3350 GRAN Take by mouth.     UNABLE TO FIND Beef liver     vitamin B-12 (CYANOCOBALAMIN) 500 MCG tablet      vitamin C (ASCORBIC ACID) 500 MG tablet      zinc gluconate 50 MG tablet Take 50 mg by mouth daily.     No current facility-administered medications for this visit.    OBJECTIVE: Vitals:   01/10/23 1142  BP: 126/71  Pulse: 64  Temp: 98.5 F (36.9 C)  SpO2: 100%  Body mass index is 18.73 kg/m.      General: Well-developed, well-nourished, no acute distress. Eyes: Pink conjunctiva, anicteric sclera. HEENT: Normocephalic, moist mucous membranes, clear oropharnyx. Lungs: Clear to auscultation bilaterally. Heart: Regular rate and rhythm. No rubs, murmurs, or gallops. Abdomen: Soft, nontender, nondistended. No organomegaly noted, normoactive bowel sounds. Musculoskeletal: No edema, cyanosis, or clubbing. Neuro: Alert, answering all questions appropriately. Cranial nerves grossly intact. Skin: No rashes or petechiae noted. Psych: Normal affect. Lymphatics: No cervical, calvicular, axillary or inguinal LAD.   LAB RESULTS:  No results found for: "NA", "K", "CL", "CO2", "GLUCOSE", "BUN", "CREATININE", "CALCIUM",  "PROT", "ALBUMIN", "AST", "ALT", "ALKPHOS", "BILITOT", "GFRNONAA", "GFRAA"  No results found for: "WBC", "NEUTROABS", "HGB", "HCT", "MCV", "PLT"  No results found for: "TIBC", "FERRITIN", "IRONPCTSAT"   STUDIES: No results found.  ASSESSMENT AND PLAN:   Brittany Murray is a 47 y.o. female with no significant past medical history was referred to hematology for management of iron deficiency.  # Iron deficiency anemia # Heavy menstrual period -Progressive.  Present at least since March 2023. Labs reviewed from 01/08/2023.  WBC 5.1, hemoglobin 7.9, MCV 72, platelets 428.  Vitamin B12 603, folic acid 10.1.  Iron 13, saturation 3% and ferritin 3. -Has been taking beef/liver supplements for the past year.  I discussed with the patient about oral iron once a day.  Side effects such as constipation, diarrhea, upset stomach, nausea, black-colored stool and metallic taste was discussed.  Alternative would be IV Venofer 200 mg weekly x 5 doses.  Occasional side effects such as back pain and nausea was discussed.  Rarely can cause allergic reaction.  Patient reports that she has not tried oral iron and would like to start with that first.  She is a self-pay and is also concerned about the cost of iron infusions.  I advised to start Ferrex (iron polysaccharide) 150 mg daily with vitamin C available OTC.  Less likely to have GI symptoms.  -Her iron deficiency is likely related to heavy menstrual period. Patient denies any bleeding in urine and stool.  Will test occult stool x 3.  Would like to hold off on GI workup until we assess response to oral iron in 2 months.  I also advised the patient to discuss with her primary about any alternative OCP (Sprintec currently) that may help control menstrual bleeding further.  -Patient was advised that in the interim if she has worsening symptoms she should reach out to me sooner  Orders Placed This Encounter  Procedures   Occult blood card to lab, stool   Occult blood  card to lab, stool   Occult blood card to lab, stool   CBC with Differential/Platelet   Iron and TIBC   Ferritin   RTC in 2 months for MD visit, labs  Patient expressed understanding and was in agreement with this plan. She also understands that She can call clinic at any time with any questions, concerns, or complaints.   I spent a total of 45 minutes reviewing chart data, face-to-face evaluation with the patient, counseling and coordination of care as detailed above.  Michaelyn Barter, MD   01/10/2023 12:46 PM

## 2023-01-14 ENCOUNTER — Other Ambulatory Visit: Payer: Self-pay

## 2023-01-14 DIAGNOSIS — D509 Iron deficiency anemia, unspecified: Secondary | ICD-10-CM

## 2023-01-14 LAB — OCCULT BLOOD X 1 CARD TO LAB, STOOL
Fecal Occult Bld: NEGATIVE
Fecal Occult Bld: NEGATIVE
Fecal Occult Bld: NEGATIVE

## 2023-02-24 ENCOUNTER — Other Ambulatory Visit: Payer: Self-pay | Admitting: Family

## 2023-02-24 DIAGNOSIS — R1030 Lower abdominal pain, unspecified: Secondary | ICD-10-CM

## 2023-02-27 ENCOUNTER — Ambulatory Visit
Admission: RE | Admit: 2023-02-27 | Discharge: 2023-02-27 | Disposition: A | Discharge status: Home / Self Care | Payer: Self-pay | Source: Ambulatory Visit | Attending: Family | Admitting: Family

## 2023-02-27 DIAGNOSIS — R1030 Lower abdominal pain, unspecified: Secondary | ICD-10-CM

## 2023-03-11 ENCOUNTER — Telehealth: Payer: Self-pay

## 2023-03-11 ENCOUNTER — Inpatient Hospital Stay: Payer: Self-pay | Attending: Internal Medicine

## 2023-03-11 ENCOUNTER — Inpatient Hospital Stay (HOSPITAL_BASED_OUTPATIENT_CLINIC_OR_DEPARTMENT_OTHER): Payer: Self-pay | Admitting: Internal Medicine

## 2023-03-11 VITALS — BP 110/76 | HR 56 | Temp 98.6°F | Wt 125.4 lb

## 2023-03-11 DIAGNOSIS — N920 Excessive and frequent menstruation with regular cycle: Secondary | ICD-10-CM

## 2023-03-11 DIAGNOSIS — D509 Iron deficiency anemia, unspecified: Secondary | ICD-10-CM | POA: Insufficient documentation

## 2023-03-11 DIAGNOSIS — Z803 Family history of malignant neoplasm of breast: Secondary | ICD-10-CM | POA: Insufficient documentation

## 2023-03-11 DIAGNOSIS — Z800 Family history of malignant neoplasm of digestive organs: Secondary | ICD-10-CM | POA: Insufficient documentation

## 2023-03-11 DIAGNOSIS — D259 Leiomyoma of uterus, unspecified: Secondary | ICD-10-CM | POA: Insufficient documentation

## 2023-03-11 LAB — CBC WITH DIFFERENTIAL/PLATELET
Abs Immature Granulocytes: 0.02 10*3/uL (ref 0.00–0.07)
Basophils Absolute: 0.1 10*3/uL (ref 0.0–0.1)
Basophils Relative: 1 %
Eosinophils Absolute: 0.3 10*3/uL (ref 0.0–0.5)
Eosinophils Relative: 4 %
HCT: 32.9 % — ABNORMAL LOW (ref 36.0–46.0)
Hemoglobin: 9.6 g/dL — ABNORMAL LOW (ref 12.0–15.0)
Immature Granulocytes: 0 %
Lymphocytes Relative: 25 %
Lymphs Abs: 1.6 10*3/uL (ref 0.7–4.0)
MCH: 22.6 pg — ABNORMAL LOW (ref 26.0–34.0)
MCHC: 29.2 g/dL — ABNORMAL LOW (ref 30.0–36.0)
MCV: 77.4 fL — ABNORMAL LOW (ref 80.0–100.0)
Monocytes Absolute: 0.4 10*3/uL (ref 0.1–1.0)
Monocytes Relative: 7 %
Neutro Abs: 4.1 10*3/uL (ref 1.7–7.7)
Neutrophils Relative %: 63 %
Platelets: 344 10*3/uL (ref 150–400)
RBC: 4.25 MIL/uL (ref 3.87–5.11)
RDW: 23 % — ABNORMAL HIGH (ref 11.5–15.5)
WBC: 6.5 10*3/uL (ref 4.0–10.5)
nRBC: 0 % (ref 0.0–0.2)

## 2023-03-11 LAB — IRON AND TIBC
Iron: 23 ug/dL — ABNORMAL LOW (ref 28–170)
Saturation Ratios: 5 % — ABNORMAL LOW (ref 10.4–31.8)
TIBC: 507 ug/dL — ABNORMAL HIGH (ref 250–450)
UIBC: 484 ug/dL

## 2023-03-11 LAB — FERRITIN: Ferritin: 2 ng/mL — ABNORMAL LOW (ref 11–307)

## 2023-03-11 NOTE — Progress Notes (Signed)
Patient stated that about 2 weeks ago she was diagnosed with a large fibroid by her GYN.   Since her last visit, nothing has really changed, she still has a little nausea; usually is in the morning. Fatigue is still present, a little bit of constipation (takes mirilax which seems to help). Never really that hungry, she did change her diet recently (cut out processed carbs or red meat).

## 2023-03-11 NOTE — Progress Notes (Signed)
[facility]  Telephone:(336) [phone] Fax:(336) [phone]  ID: Brittany Murray OB: 04/16/1976  MR#: [id number]  AOZ#:[id number]  Patient Care Team: Trey Sailors, PA-C (Inactive) as PCP - General (Physician Assistant) Michaelyn Barter, MD as Consulting Physician (Oncology)  REFERRING PROVIDER: Osvaldo Angst  REASON FOR REFERRAL: Iron deficiency anemia  HPI: Brittany Murray is a 47 y.o. female with no significant past medical history was referred to hematology for management of iron deficiency anemia.  Menstrual cycles-every month, midcycle is heavy, she uses heavy pad and tampon and has to change every 1-1 and half hours.  Is on Sprintec OCP. Denies any bleeding in urine or stool.  Last colonoscopy was in 2003 when she was having pelvic pain and was eventually diagnosed with interstitial cystitis.  She is not on any medications.  She has frequency but not bothersome. Denies any gastric bypass surgery.  No excessive NSAID use. Colon cancer.  Left breast cancer in her mother and maternal grandmother and aunt Has a shortness of breath on exertion which is progressive for the past 4 to 6 months.  Feels fatigued.  Has brain fog. She has taken iron pills during pregnancy.  Not otherwise.  Has been taking the flavor supplements for the past year.  Labs reviewed from 01/08/2023.  WBC 5.1, hemoglobin 7.9, MCV 72, platelets 428.  Vitamin B12 603, folic acid 10.1.  Iron 13, saturation 3% and ferritin 3.  Started Niferex 150 mg daily in June 2024.  Denies any side effects.  # Interval history Patient was seen today as follow-up for iron deficiency anemia and labs. She has been taking iron pills on daily basis.  Denies any side effects.  Feels nauseous which was there even prior to starting iron pill and it has not been worsened.  Did not notice much improvement in her energy level or shortness of breath.  Under had pelvic ultrasound which showed large uterine fibroid.  REVIEW OF  SYSTEMS:   ROS  As per HPI. Otherwise, a complete review of systems is negative.  PAST MEDICAL HISTORY: Past Medical History:  Diagnosis Date   Family history of breast cancer 02/2017   qualifies for genetic testing when has insurance    PAST SURGICAL HISTORY: Past Surgical History:  Procedure Laterality Date   NO PAST SURGERIES      FAMILY HISTORY: Family History  Problem Relation Age of Onset   Osteoarthritis Mother    Breast cancer Mother 21        again at 39   Osteoporosis Mother    Congestive Heart Failure Mother    Macular degeneration Father    Diabetes Father    Breast cancer Maternal Aunt 6   Breast cancer Maternal Aunt 50   Breast cancer Maternal Grandmother        mat great gm   Liver cancer Maternal Grandfather     HEALTH MAINTENANCE: Social History   Tobacco Use   Smoking status: Never    Passive exposure: Never   Smokeless tobacco: Never  Vaping Use   Vaping status: Never Used  Substance Use Topics   Alcohol use: No   Drug use: No     No Known Allergies  Current Outpatient Medications  Medication Sig Dispense Refill   CALCIUM PO Take by mouth.     cholecalciferol (VITAMIN D) 1000 units tablet Take by mouth.     iron polysaccharides (NIFEREX) 150 MG capsule Take 150 mg by mouth daily.     norgestimate-ethinyl  estradiol (SPRINTEC 28) 0.25-35 MG-MCG tablet Take 1 tablet by mouth daily.     Polyethylene Glycol 3350 GRAN Take by mouth.     vitamin C (ASCORBIC ACID) 500 MG tablet      zinc gluconate 50 MG tablet Take 50 mg by mouth daily.     vitamin B-12 (CYANOCOBALAMIN) 500 MCG tablet  (Patient not taking: Reported on 03/11/2023)     No current facility-administered medications for this visit.    OBJECTIVE: Vitals:   03/11/23 1012  BP: 110/76  Pulse: (!) 56  Temp: 98.6 F (37 C)  SpO2: 100%      Body mass index is 19.64 kg/m.      General: Well-developed, well-nourished, no acute distress. Eyes: Pink conjunctiva, anicteric  sclera. HEENT: Normocephalic, moist mucous membranes, clear oropharnyx. Lungs: Clear to auscultation bilaterally. Heart: Regular rate and rhythm. No rubs, murmurs, or gallops. Abdomen: Soft, nontender, nondistended. No organomegaly noted, normoactive bowel sounds. Musculoskeletal: No edema, cyanosis, or clubbing. Neuro: Alert, answering all questions appropriately. Cranial nerves grossly intact. Skin: No rashes or petechiae noted. Psych: Normal affect. Lymphatics: No cervical, calvicular, axillary or inguinal LAD.   LAB RESULTS:  No results found for: "NA", "K", "CL", "CO2", "GLUCOSE", "BUN", "CREATININE", "CALCIUM", "PROT", "ALBUMIN", "AST", "ALT", "ALKPHOS", "BILITOT", "GFRNONAA", "GFRAA"  Lab Results  Component Value Date   WBC 6.5 03/11/2023   NEUTROABS 4.1 03/11/2023   HGB 9.6 (L) 03/11/2023   HCT 32.9 (L) 03/11/2023   MCV 77.4 (L) 03/11/2023   PLT 344 03/11/2023    Lab Results  Component Value Date   TIBC 507 (H) 03/11/2023   FERRITIN 2 (L) 03/11/2023   IRONPCTSAT 5 (L) 03/11/2023     STUDIES: US PELVIC COMPLETE WITH TRANSVAGINAL  Result Date: 02/28/2023 CLINICAL DATA:  Lower abdomen pain and bloating for 4 weeks. EXAM: TRANSABDOMINAL AND TRANSVAGINAL ULTRASOUND OF PELVIS TECHNIQUE: Both transabdominal and transvaginal ultrasound examinations of the pelvis were performed. Transabdominal technique was performed for global imaging of the pelvis including uterus, ovaries, adnexal regions, and pelvic cul-de-sac. It was necessary to proceed with endovaginal exam following the transabdominal exam to visualize the ovaries. COMPARISON:  None Available. FINDINGS: Uterus Measurements: 12.3 x 8.9 x 9.7 cm = volume: 555.7 mL. A large uterine fibroid is identified measuring 7.9 x 8.7 x 9.3 cm. Endometrium Thickness: 4.9 mm.  No focal abnormality visualized. Right ovary Measurements: 3 x 1.4 x 1.5 cm = volume: 3.2 mL. Normal appearance/no adnexal mass. Left ovary Not visualized. Other  findings Small amount of free fluid is identified. IMPRESSION: 1. No acute abnormality identified. 2. Large uterine fibroid measuring 7.9 x 8.7 x 9.3 cm. 3. Small amount of free fluid is identified in the pelvis. Electronically Signed   By: Sherian Rein M.D.   On: 02/28/2023 09:28    ASSESSMENT AND PLAN:   Brittany Murray is a 47 y.o. female with no significant past medical history was referred to hematology for management of iron deficiency.  # Iron deficiency anemia # Heavy menstrual period -Progressive.  Present at least since March 2023. Labs reviewed from 01/08/2023.  WBC 5.1, hemoglobin 7.9, MCV 72, platelets 428.  Vitamin B12 603, folic acid 10.1.  Iron 13, saturation 3% and ferritin 3.  -Seen in June 2024.  At that time patient opted for oral iron supplement.  She has been taking Niferex 150 mg daily for 2 months.  Hemoglobin has mildly improved from 7.9-9.6.  Iron panel is still pending.  Patient does not notice any  improvement in her symptoms.  I discussed that at this point she would benefit from iron infusions because the iron loss is much higher than what the iron pills can provide.  Patient tells me that she is self-pay and would like to have a cost estimate before deciding on the type of infusion.  I have sent a request to scheduling to provide her estimate for IV Feraheme weekly x 2 doses and IV Venofer 300 mg weekly x 3 doses.  She will get back to me when she has finalized and I will put the treatment plan in.  She can continue to take iron supplements as she is tolerating well.    She had pelvic ultrasound done which showed large uterine fibroid.  She has appointment scheduled with OB/GYN.  I also discussed about referral to GI for chronic nausea and at least screening colonoscopy she is approaching age 20.  Patient would like to hold off on GI referral at this time.  She wants to get her iron infusions and OB/GYN appointment sorted out and next time we will rediscuss about GI.  Stool  occult x 3 was negative  Orders Placed This Encounter  Procedures   CBC with Differential (Cancer Center Only)   Ferritin   Iron and TIBC([facility])   RTC in 3 months for MD visit, labs  Patient expressed understanding and was in agreement with this plan. She also understands that She can call clinic at any time with any questions, concerns, or complaints.   I spent a total of 30 minutes reviewing chart data, face-to-face evaluation with the patient, counseling and coordination of care as detailed above.  Michaelyn Barter, MD   03/11/2023 11:59 AM

## 2023-03-11 NOTE — Telephone Encounter (Signed)
Talked to patient she wants to do the Venefor since it is cheaper.

## 2023-03-12 ENCOUNTER — Encounter: Payer: Self-pay | Admitting: Internal Medicine

## 2023-03-12 NOTE — Addendum Note (Signed)
Addended byMichaelyn Barter on: 03/12/2023 03:14 PM   Modules accepted: Orders

## 2023-03-20 ENCOUNTER — Inpatient Hospital Stay: Payer: Self-pay

## 2023-03-20 VITALS — BP 106/61 | HR 58 | Temp 97.8°F | Resp 16

## 2023-03-20 DIAGNOSIS — D500 Iron deficiency anemia secondary to blood loss (chronic): Secondary | ICD-10-CM

## 2023-03-20 MED ORDER — SODIUM CHLORIDE 0.9 % IV SOLN
300.0000 mg | Freq: Once | INTRAVENOUS | Status: AC
  Administered 2023-03-20: 300 mg via INTRAVENOUS
  Filled 2023-03-20: qty 200

## 2023-03-20 MED ORDER — SODIUM CHLORIDE 0.9 % IV SOLN
Freq: Once | INTRAVENOUS | Status: AC
  Filled 2023-03-20: qty 250

## 2023-03-27 ENCOUNTER — Inpatient Hospital Stay: Payer: Self-pay

## 2023-03-27 VITALS — BP 113/68 | HR 62 | Temp 98.0°F | Resp 18

## 2023-03-27 DIAGNOSIS — D500 Iron deficiency anemia secondary to blood loss (chronic): Secondary | ICD-10-CM

## 2023-03-27 MED ORDER — SODIUM CHLORIDE 0.9 % IV SOLN
300.0000 mg | Freq: Once | INTRAVENOUS | Status: AC
  Administered 2023-03-27: 300 mg via INTRAVENOUS
  Filled 2023-03-27: qty 300

## 2023-03-27 MED ORDER — SODIUM CHLORIDE 0.9 % IV SOLN
Freq: Once | INTRAVENOUS | Status: AC
  Filled 2023-03-27: qty 250

## 2023-04-03 ENCOUNTER — Inpatient Hospital Stay: Payer: Self-pay

## 2023-04-03 VITALS — BP 105/60 | HR 54 | Temp 97.8°F | Resp 18

## 2023-04-03 DIAGNOSIS — D500 Iron deficiency anemia secondary to blood loss (chronic): Secondary | ICD-10-CM

## 2023-04-03 MED ORDER — SODIUM CHLORIDE 0.9 % IV SOLN
300.0000 mg | Freq: Once | INTRAVENOUS | Status: AC
  Administered 2023-04-03: 300 mg via INTRAVENOUS
  Filled 2023-04-03: qty 300

## 2023-04-03 MED ORDER — SODIUM CHLORIDE 0.9 % IV SOLN
Freq: Once | INTRAVENOUS | Status: AC
  Filled 2023-04-03: qty 250

## 2023-05-27 NOTE — H&P (Signed)
Preoperative History and Physical  Chief Complaint: Brittany Murray is a 47 y.o. [id number] here for surgical management of menorrhagia with irregular cycle, fibroid uterus, anemia due to chronic blood loss and has received iron infusions.   No significant preoperative concerns.  History of Present Illness: 47 y.o. [name] female who presents to discuss ultrasound follow up from 02/27/2023 that showed a uterus measuring 12.3 x 8.9 x 9.7 cm with a large uterine fibroid measuring 7.9 x 8.7 x 9.3 cm.  The endometrial stripe was 4.9 mm.  The right ovary was normal appearing and the left was not visualized.    She has been bleeding since 03/14/2023.  She is taking COCs continuously without taking the placebo pills. She doesn't report day-to-day pain, but does have pain with intercourse.  She has been taking her COCs continuously since her hematologist recommended this to her to try to slow her bleeding.  She has been anemic and received her second iron infusion yesterday. She initially started on iron pills, which were not sufficient to make up .  Her last CBC prior to iron infusion showed a hemoglobin of 9.6 and iron indices as all very low.   She has a strong family history of breast cancer and is concerned about her ovaries fueling an estrogen-receptor positive cancer down the road.    Proposed surgery: Robot assisted total laparoscopic hysterectomy, bilateral salpingecto-oophorectomy, cystoscopy   Past Medical History:  Diagnosis Date   Anemia Sept 2023   Taking iron pills and now iron infusions   Chronic interstitial cystitis    Esophagitis, reflux    Excess, menstruation    Family history of breast cancer    Fibroid June 2024   Restless leg    Past Surgical History:  Procedure Laterality Date   No Past Surgeries     OB History  Gravida Para Term Preterm AB Living  2 2 2     2   SAB IAB Ectopic Molar Multiple Live Births            2    # Outcome Date GA Lbr Len/2nd Weight Sex Type Anes PTL Lv   2 Term 12/04/04   3.969 kg (8 lb 12 oz) F    LIV  1 Term 01/28/00   3.884 kg (8 lb 9 oz) M    LIV  Patient denies any other pertinent gynecologic issues.   Current Outpatient Medications on File Prior to Visit  Medication Sig Dispense Refill   ascorbic acid, vitamin C, 500 mg Chew      calcium carb/vit D3/minerals (CALCIUM-VITAMIN D ORAL) Take by mouth     cyanocobalamin (VITAMIN B12) 500 MCG tablet  (Patient not taking: Reported on 03/28/2023)     iron polysaccharides (FERREX) 150 mg iron capsule Take 150 mg by mouth once daily     polyethylene glycol 3350 (MIRALAX ORAL) Take by mouth     SPRINTEC, 28, 0.25-35 mg-mcg tablet Take 1 tablet by mouth once daily 90 tablet 3   zinc gluconate 50 mg tablet Take 50 mg by mouth once daily     No current facility-administered medications on file prior to visit.   No Known Allergies  Social History:   reports that she has never smoked. She has never used smokeless tobacco. She reports that she does not drink alcohol and does not use drugs.  Family History  Problem Relation Name Age of Onset   Breast cancer Mother Mom    Macular degeneration Father  Liver cancer Maternal Grandfather     Breast cancer Maternal Aunt Aunt    Breast cancer Maternal Aunt Auntie    Breast cancer Maternal Great-Grandmother      Review of Systems: Noncontributory  PHYSICAL EXAM: There were no vitals taken for this visit. CONSTITUTIONAL: Well-developed, well-nourished female in no acute distress.  HENT:  Normocephalic, atraumatic, External right and left ear normal. Oropharynx is clear and moist EYES: Conjunctivae and EOM are normal. Pupils are equal, round, and reactive to light. No scleral icterus.  NECK: Normal range of motion, supple, no masses SKIN: Skin is warm and dry. No rash noted. Not diaphoretic. No erythema. No pallor. NEUROLGIC: Alert and oriented to person, place, and time. Normal reflexes, muscle tone coordination. No cranial nerve deficit  noted. PSYCHIATRIC: Normal mood and affect. Normal behavior. Normal judgment and thought content. CARDIOVASCULAR: Normal heart rate noted, regular rhythm RESPIRATORY: Effort and breath sounds normal, no problems with respiration noted ABDOMEN: Soft, nontender, nondistended. PELVIC: Deferred MUSCULOSKELETAL: Normal range of motion. No edema and no tenderness. 2+ distal pulses.  Labs: No results found for this or any previous visit (from the past 2 weeks).  Imaging Studies: No results found.  Assessment: 1. Menorrhagia with irregular cycle   2. Uterine leiomyoma, unspecified location   3. Iron deficiency anemia due to chronic blood loss   4. Dyspareunia, female     Plan: Patient will undergo surgical management with the above-noted surgery.   The risks of surgery were discussed in detail with the patient including but not limited to: bleeding which may require transfusion or reoperation; infection which may require antibiotics; injury to surrounding organs which may involve bowel, bladder, ureters ; need for additional procedures including laparoscopy or laparotomy; thromboembolic phenomenon, surgical site problems and other postoperative/anesthesia complications. Likelihood of success in alleviating the patient's condition was discussed. Routine postoperative instructions will be reviewed with the patient and her family in detail after surgery.  The patient concurred with the proposed plan, giving informed written consent for the surgery.   Preoperative prophylactic antibiotics, as indicated, and SCDs ordered on call to the OR.  Discussed removal of ovaries. She would like to have them removed at this point. We discussed that there is some good data to support ovarian removal at the age of 59 yeas and in very high risk patients, removal prior to this age would be the recommendation.  She understands that she will be menopausal at the end of this surgery     Attestation Statement:   I  personally performed the service. (TP)  Gevon Markus Teola Bradley, MD  [facility] 05/27/2023 11:25 AM

## 2023-06-03 ENCOUNTER — Other Ambulatory Visit: Payer: Self-pay

## 2023-06-03 ENCOUNTER — Encounter
Admission: RE | Admit: 2023-06-03 | Discharge: 2023-06-03 | Disposition: A | Discharge status: Home / Self Care | Payer: Self-pay | Source: Ambulatory Visit | Attending: Obstetrics and Gynecology | Admitting: Obstetrics and Gynecology

## 2023-06-03 DIAGNOSIS — Z01812 Encounter for preprocedural laboratory examination: Secondary | ICD-10-CM

## 2023-06-03 HISTORY — DX: Iron deficiency anemia secondary to blood loss (chronic): D50.0

## 2023-06-03 HISTORY — DX: Leiomyoma of uterus, unspecified: D25.9

## 2023-06-03 HISTORY — DX: Excessive and frequent menstruation with irregular cycle: N92.1

## 2023-06-03 HISTORY — DX: Unspecified dyspareunia: N94.10

## 2023-06-03 NOTE — Patient Instructions (Addendum)
Your procedure is scheduled on: 06/12/23 - Thursday Report to the Registration Desk on the 1st floor of the [facility]. To find out your arrival time, please call [phone] between 1PM - 3PM on: 06/11/23 - Wednesday If your arrival time is 6:00 am, do not arrive before that time as the [facility] entrance doors do not open until 6:00 am.  REMEMBER: Instructions that are not followed completely may result in serious medical risk, up to and including death; or upon the discretion of your surgeon and anesthesiologist your surgery may need to be rescheduled.  Do not eat food after midnight the night before surgery.  No gum chewing or hard candies.  You may however, drink CLEAR liquids up to 2 hours before you are scheduled to arrive for your surgery. Do not drink anything within 2 hours of your scheduled arrival time.  Clear liquids include: - water  - apple juice without pulp - gatorade (not RED colors) - black coffee or tea (Do NOT add milk or creamers to the coffee or tea) Do NOT drink anything that is not on this list.  One week prior to surgery: Stop Anti-inflammatories (NSAIDS) such as Advil, Aleve, Ibuprofen, Motrin, Naproxen, Naprosyn and Aspirin based products such as Excedrin, Goody's Powder, BC Powder. You may take Tylenol if needed for pain up until the day of surgery.  Stop ANY OVER THE COUNTER supplements until after surgery.   ON THE DAY OF SURGERY ONLY TAKE THESE MEDICATIONS WITH SIPS OF WATER:  NONE  No Alcohol for 24 hours before or after surgery.  No Smoking including e-cigarettes for 24 hours before surgery.  No chewable tobacco products for at least 6 hours before surgery.  No nicotine patches on the day of surgery.  Do not use any "recreational" drugs for at least a week (preferably 2 weeks) before your surgery.  Please be advised that the combination of cocaine and anesthesia may have negative outcomes, up to and including death. If you test positive  for cocaine, your surgery will be cancelled.  On the morning of surgery brush your teeth with toothpaste and water, you may rinse your mouth with mouthwash if you wish. Do not swallow any toothpaste or mouthwash.  Use CHG Soap or wipes as directed on instruction sheet.  Do not wear jewelry, make-up, hairpins, clips or nail polish.  For welded (permanent) jewelry: bracelets, anklets, waist bands, etc.  Please have this removed prior to surgery.  If it is not removed, there is a chance that hospital personnel will need to cut it off on the day of surgery.  Do not wear lotions, powders, or perfumes.   Do not shave body hair from the neck down 48 hours before surgery.  Contact lenses, hearing aids and dentures may not be worn into surgery.  Do not bring valuables to the hospital. [facility] is not responsible for any missing/lost belongings or valuables.   Notify your doctor if there is any change in your medical condition (cold, fever, infection).  Wear comfortable clothing (specific to your surgery type) to the hospital.  After surgery, you can help prevent lung complications by doing breathing exercises.  Take deep breaths and cough every 1-2 hours. Your doctor may order a device called an Incentive Spirometer to help you take deep breaths. When coughing or sneezing, hold a pillow firmly against your incision with both hands. This is called "splinting." Doing this helps protect your incision. It also decreases belly discomfort.  If you  are being admitted to the hospital overnight, leave your suitcase in the car. After surgery it may be brought to your room.  In case of increased patient census, it may be necessary for you, the patient, to continue your postoperative care in the Same Day Surgery department.  If you are being discharged the day of surgery, you will not be allowed to drive home. You will need a responsible individual to drive you home and stay with you for 24 hours after  surgery.   If you are taking public transportation, you will need to have a responsible individual with you.  Please call the Pre-admissions Testing Dept. at [phone] if you have any questions about these instructions.  Surgery Visitation Policy:  Patients having surgery or a procedure may have two visitors.  Children under the age of 7 must have an adult with them who is not the patient.  Inpatient Visitation:    Visiting hours are 7 a.m. to 8 p.m. Up to four visitors are allowed at one time in a patient room. The visitors may rotate out with other people during the day.  One visitor age 51 or older may stay with the patient overnight and must be in the room by 8 p.m.     Preparing for Surgery with CHLORHEXIDINE GLUCONATE (CHG) Soap  Chlorhexidine Gluconate (CHG) Soap  o An antiseptic cleaner that kills germs and bonds with the skin to continue killing germs even after washing  o Used for showering the night before surgery and morning of surgery  Before surgery, you can play an important role by reducing the number of germs on your skin.  CHG (Chlorhexidine gluconate) soap is an antiseptic cleanser which kills germs and bonds with the skin to continue killing germs even after washing.  Please do not use if you have an allergy to CHG or antibacterial soaps. If your skin becomes reddened/irritated stop using the CHG.  1. Shower the NIGHT BEFORE SURGERY and the MORNING OF SURGERY with CHG soap.  2. If you choose to wash your hair, wash your hair first as usual with your normal shampoo.  3. After shampooing, rinse your hair and body thoroughly to remove the shampoo.  4. Use CHG as you would any other liquid soap. You can apply CHG directly to the skin and wash gently with a scrungie or a clean washcloth.  5. Apply the CHG soap to your body only from the neck down. Do not use on open wounds or open sores. Avoid contact with your eyes, ears, mouth, and genitals (private  parts). Wash face and genitals (private parts) with your normal soap.  6. Wash thoroughly, paying special attention to the area where your surgery will be performed.  7. Thoroughly rinse your body with warm water.  8. Do not shower/wash with your normal soap after using and rinsing off the CHG soap.  9. Pat yourself dry with a clean towel.  10. Wear clean pajamas to bed the night before surgery.  12. Place clean sheets on your bed the night of your first shower and do not sleep with pets.  13. Shower again with the CHG soap on the day of surgery prior to arriving at the hospital.  14. Do not apply any deodorants/lotions/powders.  15. Please wear clean clothes to the hospital.

## 2023-06-09 ENCOUNTER — Encounter
Admission: RE | Admit: 2023-06-09 | Discharge: 2023-06-09 | Disposition: A | Discharge status: Home / Self Care | Payer: Self-pay | Source: Ambulatory Visit | Attending: Obstetrics and Gynecology | Admitting: Obstetrics and Gynecology

## 2023-06-09 DIAGNOSIS — Z01812 Encounter for preprocedural laboratory examination: Secondary | ICD-10-CM | POA: Insufficient documentation

## 2023-06-09 DIAGNOSIS — D500 Iron deficiency anemia secondary to blood loss (chronic): Secondary | ICD-10-CM | POA: Insufficient documentation

## 2023-06-09 DIAGNOSIS — N921 Excessive and frequent menstruation with irregular cycle: Secondary | ICD-10-CM | POA: Insufficient documentation

## 2023-06-09 DIAGNOSIS — D259 Leiomyoma of uterus, unspecified: Secondary | ICD-10-CM | POA: Insufficient documentation

## 2023-06-09 LAB — TYPE AND SCREEN
ABO/RH(D): O POS
Antibody Screen: NEGATIVE

## 2023-06-09 LAB — CBC
HCT: 40.3 % (ref 36.0–46.0)
Hemoglobin: 13.3 g/dL (ref 12.0–15.0)
MCH: 29.6 pg (ref 26.0–34.0)
MCHC: 33 g/dL (ref 30.0–36.0)
MCV: 89.8 fL (ref 80.0–100.0)
Platelets: 339 10*3/uL (ref 150–400)
RBC: 4.49 MIL/uL (ref 3.87–5.11)
RDW: 17.3 % — ABNORMAL HIGH (ref 11.5–15.5)
WBC: 6.3 10*3/uL (ref 4.0–10.5)
nRBC: 0 % (ref 0.0–0.2)

## 2023-06-11 MED ORDER — LACTATED RINGERS IV SOLN: INTRAVENOUS | Status: DC

## 2023-06-11 MED ORDER — SOD CITRATE-CITRIC ACID 500-334 MG/5ML PO SOLN
30.0000 mL | ORAL | Status: DC
  Filled 2023-06-11: qty 30

## 2023-06-11 MED ORDER — CHLORHEXIDINE GLUCONATE 0.12 % MT SOLN: 15.0000 mL | Freq: Once | OROMUCOSAL | Status: DC

## 2023-06-11 MED ORDER — CEFAZOLIN SODIUM-DEXTROSE 2-4 GM/100ML-% IV SOLN
2.0000 g | INTRAVENOUS | Status: AC
Start: 2023-06-12 — End: 2023-06-13
  Administered 2023-06-12: 2 g via INTRAVENOUS

## 2023-06-11 MED ORDER — POVIDONE-IODINE 10 % EX SWAB: 2.0000 | Freq: Once | CUTANEOUS | Status: DC

## 2023-06-11 MED ORDER — ORAL CARE MOUTH RINSE: 15.0000 mL | Freq: Once | OROMUCOSAL | Status: DC

## 2023-06-11 MED ORDER — SODIUM CHLORIDE 0.9 % IV SOLN: INTRAVENOUS | Status: AC

## 2023-06-12 ENCOUNTER — Ambulatory Visit: Payer: Self-pay | Admitting: Urgent Care

## 2023-06-12 ENCOUNTER — Ambulatory Visit
Admission: RE | Admit: 2023-06-12 | Discharge: 2023-06-12 | Disposition: A | Discharge status: Home / Self Care | Payer: Self-pay | Attending: Obstetrics and Gynecology | Admitting: Obstetrics and Gynecology

## 2023-06-12 ENCOUNTER — Other Ambulatory Visit: Payer: Self-pay

## 2023-06-12 ENCOUNTER — Ambulatory Visit: Payer: Self-pay | Admitting: Certified Registered Nurse Anesthetist

## 2023-06-12 ENCOUNTER — Encounter
Admission: RE | Disposition: A | Discharge status: Home / Self Care | Payer: Self-pay | Source: Home / Self Care | Attending: Obstetrics and Gynecology

## 2023-06-12 ENCOUNTER — Encounter: Payer: Self-pay | Admitting: Obstetrics and Gynecology

## 2023-06-12 DIAGNOSIS — N941 Unspecified dyspareunia: Secondary | ICD-10-CM | POA: Diagnosis present

## 2023-06-12 DIAGNOSIS — Z9189 Other specified personal risk factors, not elsewhere classified: Secondary | ICD-10-CM

## 2023-06-12 DIAGNOSIS — D509 Iron deficiency anemia, unspecified: Secondary | ICD-10-CM | POA: Diagnosis present

## 2023-06-12 DIAGNOSIS — D259 Leiomyoma of uterus, unspecified: Secondary | ICD-10-CM | POA: Diagnosis present

## 2023-06-12 DIAGNOSIS — D500 Iron deficiency anemia secondary to blood loss (chronic): Secondary | ICD-10-CM | POA: Insufficient documentation

## 2023-06-12 DIAGNOSIS — N921 Excessive and frequent menstruation with irregular cycle: Secondary | ICD-10-CM | POA: Insufficient documentation

## 2023-06-12 DIAGNOSIS — Z01812 Encounter for preprocedural laboratory examination: Secondary | ICD-10-CM

## 2023-06-12 DIAGNOSIS — Z803 Family history of malignant neoplasm of breast: Secondary | ICD-10-CM | POA: Insufficient documentation

## 2023-06-12 HISTORY — PX: ROBOTIC ASSISTED TOTAL HYSTERECTOMY WITH BILATERAL SALPINGO OOPHERECTOMY: SHX6086

## 2023-06-12 HISTORY — PX: CYSTOSCOPY: SHX5120

## 2023-06-12 LAB — ABO/RH: ABO/RH(D): O POS

## 2023-06-12 LAB — POCT PREGNANCY, URINE: Preg Test, Ur: NEGATIVE

## 2023-06-12 SURGERY — HYSTERECTOMY, TOTAL, ROBOT-ASSISTED, LAPAROSCOPIC, WITH BILATERAL SALPINGO-OOPHORECTOMY
Anesthesia: General

## 2023-06-12 MED ORDER — ROCURONIUM BROMIDE 10 MG/ML (PF) SYRINGE
PREFILLED_SYRINGE | INTRAVENOUS | Status: AC
  Filled 2023-06-12: qty 10

## 2023-06-12 MED ORDER — ACETAMINOPHEN 10 MG/ML IV SOLN
INTRAVENOUS | Status: AC
  Filled 2023-06-12: qty 100

## 2023-06-12 MED ORDER — HEMOSTATIC AGENTS (NO CHARGE) OPTIME
TOPICAL | Status: DC | PRN
  Administered 2023-06-12: 1 via TOPICAL

## 2023-06-12 MED ORDER — OXYCODONE-ACETAMINOPHEN 5-325 MG PO TABS: 1.0000 | ORAL_TABLET | Freq: Four times a day (QID) | ORAL | 0 refills | Status: AC | PRN

## 2023-06-12 MED ORDER — ONDANSETRON HCL 4 MG/2ML IJ SOLN
INTRAMUSCULAR | Status: AC
  Filled 2023-06-12: qty 2

## 2023-06-12 MED ORDER — SODIUM CHLORIDE 0.9 % IR SOLN
Status: DC | PRN
  Administered 2023-06-12: 500 mL via INTRAVESICAL

## 2023-06-12 MED ORDER — ONDANSETRON HCL 4 MG/2ML IJ SOLN
4.0000 mg | Freq: Once | INTRAMUSCULAR | Status: AC | PRN
  Administered 2023-06-12: 4 mg via INTRAVENOUS

## 2023-06-12 MED ORDER — FENTANYL CITRATE (PF) 100 MCG/2ML IJ SOLN
INTRAMUSCULAR | Status: DC | PRN
  Administered 2023-06-12 (×2): 25 ug via INTRAVENOUS
  Administered 2023-06-12: 50 ug via INTRAVENOUS

## 2023-06-12 MED ORDER — KETOROLAC TROMETHAMINE 30 MG/ML IJ SOLN
INTRAMUSCULAR | Status: DC | PRN
  Administered 2023-06-12: 30 mg via INTRAVENOUS

## 2023-06-12 MED ORDER — HYDROMORPHONE HCL 1 MG/ML IJ SOLN
INTRAMUSCULAR | Status: AC
  Filled 2023-06-12: qty 1

## 2023-06-12 MED ORDER — DROPERIDOL 2.5 MG/ML IJ SOLN
INTRAMUSCULAR | Status: AC
  Filled 2023-06-12: qty 2

## 2023-06-12 MED ORDER — FENTANYL CITRATE (PF) 100 MCG/2ML IJ SOLN
INTRAMUSCULAR | Status: AC
  Filled 2023-06-12: qty 2

## 2023-06-12 MED ORDER — LACTATED RINGERS IV SOLN: INTRAVENOUS | Status: AC

## 2023-06-12 MED ORDER — OXYCODONE HCL 5 MG PO TABS
5.0000 mg | ORAL_TABLET | Freq: Once | ORAL | Status: AC | PRN
  Administered 2023-06-12: 5 mg via ORAL

## 2023-06-12 MED ORDER — CEFAZOLIN SODIUM-DEXTROSE 2-4 GM/100ML-% IV SOLN
INTRAVENOUS | Status: AC
  Filled 2023-06-12: qty 100

## 2023-06-12 MED ORDER — ONDANSETRON HCL 4 MG/2ML IJ SOLN
INTRAMUSCULAR | Status: DC | PRN
  Administered 2023-06-12: 4 mg via INTRAVENOUS

## 2023-06-12 MED ORDER — DROPERIDOL 2.5 MG/ML IJ SOLN
0.6250 mg | Freq: Once | INTRAMUSCULAR | Status: AC
  Administered 2023-06-12: 0.625 mg via INTRAVENOUS

## 2023-06-12 MED ORDER — HYDROMORPHONE HCL 1 MG/ML IJ SOLN
INTRAMUSCULAR | Status: DC | PRN
  Administered 2023-06-12: .5 mg via INTRAVENOUS

## 2023-06-12 MED ORDER — LIDOCAINE HCL (CARDIAC) PF 100 MG/5ML IV SOSY
PREFILLED_SYRINGE | INTRAVENOUS | Status: DC | PRN
  Administered 2023-06-12: 50 mg via INTRAVENOUS

## 2023-06-12 MED ORDER — LIDOCAINE HCL (PF) 2 % IJ SOLN
INTRAMUSCULAR | Status: AC
  Filled 2023-06-12: qty 5

## 2023-06-12 MED ORDER — GLYCOPYRROLATE 0.2 MG/ML IJ SOLN
INTRAMUSCULAR | Status: AC
  Filled 2023-06-12: qty 1

## 2023-06-12 MED ORDER — BUPIVACAINE HCL (PF) 0.5 % IJ SOLN
INTRAMUSCULAR | Status: AC
  Filled 2023-06-12: qty 30

## 2023-06-12 MED ORDER — DEXAMETHASONE SODIUM PHOSPHATE 10 MG/ML IJ SOLN
INTRAMUSCULAR | Status: AC
  Filled 2023-06-12: qty 1

## 2023-06-12 MED ORDER — PROPOFOL 10 MG/ML IV BOLUS
INTRAVENOUS | Status: AC
  Filled 2023-06-12: qty 40

## 2023-06-12 MED ORDER — FENTANYL CITRATE (PF) 100 MCG/2ML IJ SOLN
25.0000 ug | INTRAMUSCULAR | Status: DC | PRN
  Administered 2023-06-12 (×3): 25 ug via INTRAVENOUS

## 2023-06-12 MED ORDER — CHLORHEXIDINE GLUCONATE 0.12 % MT SOLN
OROMUCOSAL | Status: AC
  Filled 2023-06-12: qty 15

## 2023-06-12 MED ORDER — PROPOFOL 10 MG/ML IV BOLUS
INTRAVENOUS | Status: DC | PRN
  Administered 2023-06-12: 120 mg via INTRAVENOUS

## 2023-06-12 MED ORDER — BUPIVACAINE HCL 0.5 % IJ SOLN
INTRAMUSCULAR | Status: DC | PRN
  Administered 2023-06-12: 13 mL

## 2023-06-12 MED ORDER — DEXAMETHASONE SODIUM PHOSPHATE 10 MG/ML IJ SOLN
INTRAMUSCULAR | Status: DC | PRN
  Administered 2023-06-12: 10 mg via INTRAVENOUS

## 2023-06-12 MED ORDER — MIDAZOLAM HCL 2 MG/2ML IJ SOLN
INTRAMUSCULAR | Status: AC
  Filled 2023-06-12: qty 2

## 2023-06-12 MED ORDER — MIDAZOLAM HCL 2 MG/2ML IJ SOLN
INTRAMUSCULAR | Status: DC | PRN
  Administered 2023-06-12: .5 mg via INTRAVENOUS
  Administered 2023-06-12: 1.5 mg via INTRAVENOUS

## 2023-06-12 MED ORDER — ONDANSETRON 4 MG PO TBDP: 4.0000 mg | ORAL_TABLET | Freq: Four times a day (QID) | ORAL | 0 refills | Status: AC | PRN

## 2023-06-12 MED ORDER — ACETAMINOPHEN 10 MG/ML IV SOLN
INTRAVENOUS | Status: DC | PRN
  Administered 2023-06-12: 1000 mg via INTRAVENOUS

## 2023-06-12 MED ORDER — GLYCOPYRROLATE 0.2 MG/ML IJ SOLN
INTRAMUSCULAR | Status: DC | PRN
  Administered 2023-06-12 (×2): .1 mg via INTRAVENOUS

## 2023-06-12 MED ORDER — OXYCODONE HCL 5 MG/5ML PO SOLN
5.0000 mg | Freq: Once | ORAL | Status: AC | PRN
Start: 2023-06-12 — End: 2023-06-12

## 2023-06-12 MED ORDER — ROCURONIUM BROMIDE 100 MG/10ML IV SOLN
INTRAVENOUS | Status: DC | PRN
  Administered 2023-06-12: 50 mg via INTRAVENOUS
  Administered 2023-06-12 (×2): 20 mg via INTRAVENOUS
  Administered 2023-06-12: 30 mg via INTRAVENOUS

## 2023-06-12 MED ORDER — SUGAMMADEX SODIUM 200 MG/2ML IV SOLN
INTRAVENOUS | Status: DC | PRN
  Administered 2023-06-12: 108.8 mg via INTRAVENOUS

## 2023-06-12 MED ORDER — OXYCODONE HCL 5 MG PO TABS
ORAL_TABLET | ORAL | Status: AC
  Filled 2023-06-12: qty 1

## 2023-06-12 MED ORDER — ACETAMINOPHEN 10 MG/ML IV SOLN: 1000.0000 mg | Freq: Once | INTRAVENOUS | Status: DC | PRN

## 2023-06-12 MED ORDER — IBUPROFEN 600 MG PO TABS: 600.0000 mg | ORAL_TABLET | Freq: Four times a day (QID) | ORAL | 0 refills | Status: AC

## 2023-06-12 SURGICAL SUPPLY — 76 items
ADH SKN CLS APL DERMABOND .7 (GAUZE/BANDAGES/DRESSINGS) ×2
APL SRG 38 LTWT LNG FL B (MISCELLANEOUS) ×2
APPLICATOR ARISTA FLEXITIP XL (MISCELLANEOUS) IMPLANT
BAG DRN RND TRDRP ANRFLXCHMBR (UROLOGICAL SUPPLIES) ×2
BAG TISS RTRVL C1850 15 (BAG) ×2
BAG URINE DRAIN 2000ML AR STRL (UROLOGICAL SUPPLIES) ×2 IMPLANT
BLADE SURG 15 STRL LF DISP TIS (BLADE) ×2 IMPLANT
BLADE SURG 15 STRL SS (BLADE) ×2
BLADE SURG SZ11 CARB STEEL (BLADE) ×2 IMPLANT
CANNULA CAP OBTURATR AIRSEAL 8 (CAP) ×2 IMPLANT
CANNULA REDUCER 12-8 DVNC XI (CANNULA) IMPLANT
CATH FOLEY 2WAY 5CC 16FR (CATHETERS) ×2
CATH URTH 16FR FL 2W BLN LF (CATHETERS) ×2 IMPLANT
COVER TIP SHEARS 8 DVNC (MISCELLANEOUS) ×2 IMPLANT
DERMABOND ADVANCED .7 DNX12 (GAUZE/BANDAGES/DRESSINGS) ×2 IMPLANT
DRAPE ARM DVNC X/XI (DISPOSABLE) ×8 IMPLANT
DRAPE CAMERA CLOSED 9X96 (DRAPES) IMPLANT
DRAPE COLUMN DVNC XI (DISPOSABLE) ×2 IMPLANT
DRAPE ROBOT W/ LEGGING 30X125 (DRAPES) ×2 IMPLANT
DRAPE SHEET LG 3/4 BI-LAMINATE (DRAPES) IMPLANT
DRAPE UNDER BUTTOCK W/FLU (DRAPES) ×2 IMPLANT
DRIVER NDL MEGA SUTCUT DVNCXI (INSTRUMENTS) ×2 IMPLANT
DRIVER NDLE MEGA SUTCUT DVNCXI (INSTRUMENTS) ×2
ELECT REM PT RETURN 9FT ADLT (ELECTROSURGICAL) ×2
ELECTRODE REM PT RTRN 9FT ADLT (ELECTROSURGICAL) ×2 IMPLANT
FORCEPS BPLR FENES DVNC XI (FORCEP) ×2 IMPLANT
FORCEPS BPLR R/ABLATION 8 DVNC (INSTRUMENTS) ×2 IMPLANT
FORCEPS TENACULUM DVNC XI (FORCEP) IMPLANT
GAUZE 4X4 16PLY ~~LOC~~+RFID DBL (SPONGE) ×2 IMPLANT
GLOVE BIO SURGEON STRL SZ7 (GLOVE) ×6 IMPLANT
GLOVE BIOGEL PI IND STRL 7.5 (GLOVE) ×6 IMPLANT
GOWN STRL REUS W/ TWL LRG LVL3 (GOWN DISPOSABLE) ×6 IMPLANT
GOWN STRL REUS W/ TWL XL LVL3 (GOWN DISPOSABLE) ×2 IMPLANT
GOWN STRL REUS W/TWL LRG LVL3 (GOWN DISPOSABLE) ×12
GOWN STRL REUS W/TWL XL LVL3 (GOWN DISPOSABLE) ×2
HANDLE YANKAUER SUCT BULB TIP (MISCELLANEOUS) IMPLANT
HEMOSTAT ARISTA ABSORB 3G PWDR (HEMOSTASIS) IMPLANT
IRRIGATOR SUCT 8 DISP DVNC XI (IRRIGATION / IRRIGATOR) IMPLANT
IV NS 1000ML (IV SOLUTION) ×2
IV NS 1000ML BAXH (IV SOLUTION) ×2 IMPLANT
KIT PINK PAD W/HEAD ARE REST (MISCELLANEOUS) ×2
KIT PINK PAD W/HEAD ARM REST (MISCELLANEOUS) ×2 IMPLANT
LABEL OR SOLS (LABEL) ×2 IMPLANT
MANIFOLD NEPTUNE II (INSTRUMENTS) ×2 IMPLANT
MANIPULATOR VCARE LG CRV RETR (MISCELLANEOUS) IMPLANT
NDL HYPO 22X1.5 SAFETY MO (MISCELLANEOUS) ×2 IMPLANT
NEEDLE HYPO 22X1.5 SAFETY MO (MISCELLANEOUS) ×2
NS IRRIG 500ML POUR BTL (IV SOLUTION) ×2 IMPLANT
OBTURATOR OPTICAL STND 8 DVNC (TROCAR) ×2
OBTURATOR OPTICALSTD 8 DVNC (TROCAR) ×2 IMPLANT
OCCLUDER COLPOPNEUMO (BALLOONS) ×2 IMPLANT
PACK LAP CHOLECYSTECTOMY (MISCELLANEOUS) ×2 IMPLANT
PAD PREP OB/GYN DISP 24X41 (PERSONAL CARE ITEMS) ×2 IMPLANT
SCISSORS MNPLR CVD DVNC XI (INSTRUMENTS) ×2 IMPLANT
SCRUB CHG 4% DYNA-HEX 4OZ (MISCELLANEOUS) ×2 IMPLANT
SEAL UNIV 5-12 XI (MISCELLANEOUS) ×6 IMPLANT
SEALER VESSEL EXT DVNC XI (MISCELLANEOUS) IMPLANT
SET CYSTO W/LG BORE CLAMP LF (SET/KITS/TRAYS/PACK) ×2 IMPLANT
SET TUBE FILTERED XL AIRSEAL (SET/KITS/TRAYS/PACK) ×2 IMPLANT
SOL ELECTROSURG ANTI STICK (MISCELLANEOUS) ×2
SOL PREP PVP 2OZ (MISCELLANEOUS) ×2
SOLUTION ELECTROSURG ANTI STCK (MISCELLANEOUS) ×2 IMPLANT
SOLUTION PREP PVP 2OZ (MISCELLANEOUS) IMPLANT
SURGILUBE 2OZ TUBE FLIPTOP (MISCELLANEOUS) ×2 IMPLANT
SUT MNCRL 4-0 (SUTURE) ×2
SUT MNCRL 4-0 27XMFL (SUTURE) ×2
SUT VIC AB 0 CT2 27 (SUTURE) ×2 IMPLANT
SUT VICRYL 0 UR6 27IN ABS (SUTURE) IMPLANT
SUT VLOC 180 0 6IN GS21 (SUTURE) IMPLANT
SUTURE MNCRL 4-0 27XMF (SUTURE) ×2 IMPLANT
SYR 10ML LL (SYRINGE) ×2 IMPLANT
SYR 50ML LL SCALE MARK (SYRINGE) ×2 IMPLANT
SYS TROCAR 1.5-3 SLV ABD GEL (ENDOMECHANICALS) ×2
SYSTEM BAG RETR ANCHOR TIS (BAG) IMPLANT
SYSTEM TROCR 1.5-3 SLV ABD GEL (ENDOMECHANICALS) IMPLANT
WATER STERILE IRR 500ML POUR (IV SOLUTION) ×2 IMPLANT

## 2023-06-12 NOTE — Anesthesia Procedure Notes (Signed)
Procedure Name: Intubation Date/Time: 06/12/2023 7:44 AM  Performed by: Malva Cogan, CRNAPre-anesthesia Checklist: Patient identified, Patient being monitored, Timeout performed, Emergency Drugs available and Suction available Patient Re-evaluated:Patient Re-evaluated prior to induction Oxygen Delivery Method: Circle system utilized Preoxygenation: Pre-oxygenation with 100% oxygen Induction Type: IV induction Ventilation: Mask ventilation without difficulty Laryngoscope Size: 3 and McGraph Grade View: Grade I Tube type: Oral Tube size: 6.5 mm Number of attempts: 1 Airway Equipment and Method: Stylet and Video-laryngoscopy Placement Confirmation: ETT inserted through vocal cords under direct vision, positive ETCO2 and breath sounds checked- equal and bilateral Secured at: 22 cm Tube secured with: Tape Dental Injury: Teeth and Oropharynx as per pre-operative assessment

## 2023-06-12 NOTE — Anesthesia Preprocedure Evaluation (Addendum)
Anesthesia Evaluation  Patient identified by MRN, date of birth, ID band Patient awake    Reviewed: Allergy & Precautions, NPO status , Patient's Chart, lab work & pertinent test results  History of Anesthesia Complications Negative for: history of anesthetic complications  Airway Mallampati: I   Neck ROM: Full    Dental  (+) Chipped   Pulmonary neg pulmonary ROS   Pulmonary exam normal breath sounds clear to auscultation       Cardiovascular Exercise Tolerance: Good negative cardio ROS Normal cardiovascular exam Rhythm:Regular Rate:Normal     Neuro/Psych negative neurological ROS     GI/Hepatic negative GI ROS,,,  Endo/Other  negative endocrine ROS    Renal/GU negative Renal ROS     Musculoskeletal   Abdominal   Peds  Hematology  (+) Blood dyscrasia, anemia   Anesthesia Other Findings   Reproductive/Obstetrics Uterine fibroids                             Anesthesia Physical Anesthesia Plan  ASA: 2  Anesthesia Plan: General   Post-op Pain Management:    Induction: Intravenous  PONV Risk Score and Plan: 3 and Ondansetron, Dexamethasone and Treatment may vary due to age or medical condition  Airway Management Planned: Oral ETT  Additional Equipment:   Intra-op Plan:   Post-operative Plan: Extubation in OR  Informed Consent: I have reviewed the patients History and Physical, chart, labs and discussed the procedure including the risks, benefits and alternatives for the proposed anesthesia with the patient or authorized representative who has indicated his/her understanding and acceptance.     Dental advisory given  Plan Discussed with: CRNA  Anesthesia Plan Comments: (Patient consented for risks of anesthesia including but not limited to:  - adverse reactions to medications - damage to eyes, teeth, lips or other oral mucosa - nerve damage due to positioning  - sore  throat or hoarseness - damage to heart, brain, nerves, lungs, other parts of body or loss of life  Informed patient about role of CRNA in peri- and intra-operative care.  Patient voiced understanding.)       Anesthesia Quick Evaluation

## 2023-06-12 NOTE — Interval H&P Note (Signed)
History and Physical Interval Note:  06/12/2023 7:11 AM  Brittany Murray  has presented today for surgery, with the diagnosis of menorrhagia, fibroid uterus, anemia due to chronic blood loss.  The various methods of treatment have been discussed with the patient and family. After consideration of risks, benefits and other options for treatment, the patient has consented to  Procedure(s): XI ROBOTIC ASSISTED TOTAL HYSTERECTOMY WITH BILATERAL SALPINGO OOPHORECTOMY (Bilateral) CYSTOSCOPY (N/A) as a surgical intervention.  The patient's history has been reviewed, patient examined, no change in status, stable for surgery.  I have reviewed the patient's chart and labs.  Questions were answered to the patient's satisfaction.  Re-affirmed patient's desire to proceed. Consents reviewed.  Thomasene Mohair, MD, [facility] 06/12/2023 7:11 AM

## 2023-06-12 NOTE — Transfer of Care (Signed)
Immediate Anesthesia Transfer of Care Note  Patient: Brittany Murray  Procedure(s) Performed: XI ROBOTIC ASSISTED TOTAL HYSTERECTOMY WITH BILATERAL SALPINGO OOPHORECTOMY (Bilateral) CYSTOSCOPY  Patient Location: PACU  Anesthesia Type:General  Level of Consciousness: drowsy  Airway & Oxygen Therapy: Patient Spontanous Breathing and Patient connected to face mask oxygen  Post-op Assessment: Report given to RN and Post -op Vital signs reviewed and stable  Post vital signs: Reviewed and stable  Last Vitals:  Vitals Value Taken Time  BP 113/66 06/12/23 1145  Temp 36.6 C 06/12/23 1143  Pulse 57 06/12/23 1146  Resp 14 06/12/23 1146  SpO2 100 % 06/12/23 1146  Vitals shown include unfiled device data.  Last Pain:  Vitals:   06/12/23 0625  PainSc: 0-No pain         Complications: No notable events documented.

## 2023-06-12 NOTE — Op Note (Signed)
Operative Note    Name: Brittany Murray  Date of Service: 06/12/2023   DOB: Dec 13, 1975  MRN: [id number]    Pre-Operative Diagnosis:  1) Menorrhagia with irregular cycle  2) Uterine leiomyoma, unspecified location  3) Iron deficiency anemia due to chronic blood loss  4) Dyspareunia, female  5) At high risk for breast cancer  Post-Operative Diagnosis:  1) Menorrhagia with irregular cycle  2) Uterine leiomyoma, unspecified location  3) Iron deficiency anemia due to chronic blood loss  4) Dyspareunia, female  5) At high risk for breast cancer  Procedures:  1) Robot assisted Total Laparoscopic Hysterectomy, bilateral salpingo-oophorectomy  2) Cystoscopy  Primary Surgeon: Thomasene Mohair, MD  Assistant Surgeon: Christeen Douglas, MD   EBL: 25 mL   IVF: 800 mL   Urine output: 200 mL  Specimens:  Uterus with cervix, bilateral fallopian tubes and ovaries with fibroids (total weight 545 grams)  Drains: none  Complications: None   Disposition: PACU   Condition: Stable   Findings:  1) enlarged uterus with apparent anterior fibroid obscuring the lower uterine segment 2) normal appearing ovaries, bilateral fallopian tubes 3) on cystoscopy: no evidence of bladder damage and there was normal efflux of urine from the bilateral ureteral orifices.  Procedure Summary:  The patient was taken to the operating room where general anesthesia was administered and found to be adequate. She was placed in the dorsal supine lithotomy position in Barnes stirrups and prepped and draped in the usual, sterile fashion. After a timeout was called an indwelling catheter was placed in her bladder.  A sterile speculum was placed in her vagina.  The anterior lip of the cervix was grasped with the single-tooth tenaculum.  The cervix was serially dilated to an 11 Pratt dilator.  The large Vcare device was placed in accordance to the manufacturer's recommendations.  The tenaculum and speculum were removed.    Attention was turned to the abdomen where after injection of local anesthetic, an 8 mm supraumbilical incision was made with the scalpel. Entry into the abdomen was obtained via Optiview trocar technique (a blunt entry technique with camera visualization through the obturator upon entry). Verification of entry into the abdomen was obtained using opening pressures. The abdomen was insufflated with CO2. The camera was introduced through the trocar with verification of atraumatic entry.  Right and left abdominal entry sites were created after injection of local anesthetic about 8 cm lateral to the umbilical port in accordance with the Intuitive manufacturer's recommendations.  An additional port was placed 8 cm lateral to the right abdominal port with verification of clearance above the iliac crest by more than 2 cm.  The port sites were 8 mm.  The intuitive trochars were introduced under intra-abdominal camera visualization without difficulty.  The supraumbilical port was removed and the incision was extended to about 2-1/2 cm.  The mini GelPort retractor and gel covering were placed through this incision.  A large specimen retrieval bag was placed through the GelPort.  The 8 mm trocar was placed through the gel for visualization in the supraumbilical incision.  The majority of this was accomplished with pneumoperitoneum and under direct visualization.   The XI robot was docked on the patient's left.  Clearance was verified from the patient's legs.  Through the umbilical port the camera was placed.  Through the port attached to arm 3 the monopolar scissors were placed.  Through the port attached to arm 4 the forced bipolar forceps were was placed.  The vessel  sealer was attached to port 1.   An inspection was undertaken of the pelvis with the above-noted findings. The bilateral ureters were identified and found to be well away from the operative area of interest. The round ligament was cauterized and transected  on the right. The broad ligament was opened posteriorly and anteriorly.  The retroperitoneal space was divided until the ureter was identified and found to have a normal course.  The posterior leaf of the broad ligament was opened and the peritoneum was extended in order to skeletonized the right infundibulopelvic ligament.  Verifying that the ureter was away from this area, the infundibulopelvic ligament was cauterized and transected using the vessel sealer device. Tissue was divided along the right broad ligament to the level of the internal cervical os.  The bladder was found to be attached at a very high level on the lower uterine segment.  Therefore the bladder was dissected off the lateral vasculature on the right and found to be well away from this area while securing this vasculature.  The right uterine artery was skeletonized and identified and after ligation was transected. The same procedure was carried out on the left side.  With lateral visualization of the bladder, the bladder was gently taken down until the anterior cervix could be visualized.  The colpotomy was performed using monopolar electrocautery in a circumferential fashion following the KOH ring.  The specimen was placed in the retrieval bag and moved out of the pelvis for later removal.   Closure of the vaginal cuff was undertaken using the V-lock stitch in a running fashion.  All vascular pedicles were inspected and found to be hemostatic.  The intra-abdominal pressure was lowered to 5 mmHg and ongoing hemostasis was noted.  Arista 3 g was placed over all vascular pedicles to ensure ongoing hemostasis.  All instruments removed from the robotic ports.  The robot was undocked from the patient.    The GelPort was removed and the specimen was brought to the supra umbilical incision.  The skin retraction device was removed and the retrieval bag was brought further up to the incision.  Visualization in the abdominal cavity was used to verify  that no bowel or other tissue had been brought to the incision as well.  The GelPort retractor was placed again on the inside of the specimen retrieval bag to create an opening in the abdominal wall to retrieve the specimen from the retrieval bag.  Using the Excite technique the specimen was removed and several large strips.  The GelPort abdominal wall retractor was removed and the specimen bag was removed from the patient's body.  The fascia at the supraumbilical incision was closed using interrupted 0 Vicryl stitches.  The remaining trocars were removed after 5 deep breaths from anesthesia were utilized to aid in clearing all CO2 from the patient's abdominal cavity.  Each trocar was removed by placing an obturator through the device in the form of a sponge that is normally used to clean the trocar to ensure that no bowel was brought through the incision due to vacuum effect.  All skin sites were closed using 4-0 Monocryl and reinforced using surgical skin glue.  Cystoscopy was undertaken at this point. The Foley catheter was removed and the 70 cystoscope was gently introduced through the urethra. The bladder survey was undertaken with efflux of urine from both orifices noted. There were no defects noted in the bladder wall. The cystoscope was utilized to fully empty the bladder.  A sterile  speculum was placed in the vagina and visualization of the vaginal cuff was undertaken to ensure hemostasis.  The vagina was verified to be free of instruments and sponges.    During this case the assistant helped create trocar sites, manipulate the uterus, removed the specimen from the abdominal cavity, perform cystoscopy, and close skin incisions.  The patient tolerated the procedure well.  Sponge, lap, needle, and instrument counts were correct x 2.  VTE prophylaxis: SCDs. Antibiotic prophylaxis: Ancef 2 grams IV prior to skin incision.  The assistant was an MD due to the lack of availability of another high skilled  assistant.  She was awakened in the operating room and was taken to the PACU in stable condition.   Thomasene Mohair, MD, [facility] 06/12/2023 11:33 AM

## 2023-06-12 NOTE — Anesthesia Postprocedure Evaluation (Signed)
Anesthesia Post Note  Patient: Brittany Murray  Procedure(s) Performed: XI ROBOTIC ASSISTED TOTAL HYSTERECTOMY WITH BILATERAL SALPINGO OOPHORECTOMY (Bilateral) CYSTOSCOPY  Patient location during evaluation: PACU Anesthesia Type: General Level of consciousness: awake and alert, oriented and patient cooperative Pain management: pain level controlled Vital Signs Assessment: post-procedure vital signs reviewed and stable Respiratory status: spontaneous breathing, nonlabored ventilation and respiratory function stable Cardiovascular status: blood pressure returned to baseline and stable Postop Assessment: adequate PO intake Anesthetic complications: no   No notable events documented.   Last Vitals:  Vitals:   06/12/23 1230 06/12/23 1245  BP: 111/67 (!) 105/55  Pulse: 60 66  Resp: 11 19  Temp:    SpO2: 99% 100%    Last Pain:  Vitals:   06/12/23 1230  PainSc: Asleep                 Reed Breech

## 2023-06-13 ENCOUNTER — Ambulatory Visit: Payer: Self-pay | Admitting: Internal Medicine

## 2023-06-13 ENCOUNTER — Other Ambulatory Visit: Payer: Self-pay

## 2023-06-13 ENCOUNTER — Encounter: Payer: Self-pay | Admitting: Obstetrics and Gynecology

## 2023-06-16 ENCOUNTER — Telehealth: Payer: Self-pay | Admitting: Internal Medicine

## 2023-06-17 LAB — SURGICAL PATHOLOGY
